# Patient Record
Sex: Female | Born: 1940 | Race: Black or African American | Hispanic: No
Health system: Southern US, Community
[De-identification: ages and names within clinical notes are randomized; demographics above are authoritative.]

---

## 2006-02-28 ENCOUNTER — Other Ambulatory Visit: Payer: Self-pay

## 2006-02-28 ENCOUNTER — Emergency Department: Payer: Self-pay | Admitting: Unknown Physician Specialty

## 2006-04-27 ENCOUNTER — Ambulatory Visit: Payer: Self-pay | Admitting: Gastroenterology

## 2006-11-08 ENCOUNTER — Ambulatory Visit: Payer: Self-pay | Admitting: Internal Medicine

## 2006-11-09 ENCOUNTER — Other Ambulatory Visit: Payer: Self-pay

## 2006-11-09 ENCOUNTER — Inpatient Hospital Stay: Payer: Self-pay | Admitting: Internal Medicine

## 2007-06-19 ENCOUNTER — Other Ambulatory Visit: Payer: Self-pay

## 2007-06-19 ENCOUNTER — Inpatient Hospital Stay: Payer: Self-pay | Admitting: Internal Medicine

## 2007-10-20 ENCOUNTER — Ambulatory Visit: Payer: Self-pay | Admitting: Internal Medicine

## 2008-08-21 ENCOUNTER — Emergency Department: Payer: Self-pay | Admitting: Internal Medicine

## 2008-12-26 ENCOUNTER — Inpatient Hospital Stay: Payer: Self-pay | Admitting: Internal Medicine

## 2009-06-06 ENCOUNTER — Inpatient Hospital Stay (HOSPITAL_COMMUNITY)
Admission: EM | Admit: 2009-06-06 | Discharge: 2009-06-10 | Payer: Self-pay | Source: Home / Self Care | Admitting: Emergency Medicine

## 2009-07-26 ENCOUNTER — Ambulatory Visit: Payer: Self-pay

## 2009-09-07 ENCOUNTER — Ambulatory Visit: Payer: Self-pay

## 2010-01-26 ENCOUNTER — Inpatient Hospital Stay (HOSPITAL_COMMUNITY)
Admission: EM | Admit: 2010-01-26 | Discharge: 2010-01-30 | Payer: Self-pay | Source: Home / Self Care | Attending: Internal Medicine | Admitting: Internal Medicine

## 2010-01-28 ENCOUNTER — Other Ambulatory Visit: Payer: Self-pay | Admitting: Internal Medicine

## 2010-01-28 ENCOUNTER — Encounter (INDEPENDENT_AMBULATORY_CARE_PROVIDER_SITE_OTHER): Payer: Self-pay | Admitting: Internal Medicine

## 2010-01-28 LAB — GLUCOSE, CAPILLARY
Glucose-Capillary: 199 mg/dL — ABNORMAL HIGH (ref 70–99)
Glucose-Capillary: 380 mg/dL — ABNORMAL HIGH (ref 70–99)
Glucose-Capillary: 183 mg/dL — ABNORMAL HIGH (ref 70–99)
Glucose-Capillary: 323 mg/dL — ABNORMAL HIGH (ref 70–99)

## 2010-01-28 LAB — CBC
HCT: 36.5 % (ref 36.0–46.0)
Hemoglobin: 11.6 g/dL — ABNORMAL LOW (ref 12.0–15.0)
MCH: 24.6 pg — ABNORMAL LOW (ref 26.0–34.0)
MCHC: 31.8 g/dL (ref 30.0–36.0)
MCV: 77.5 fL — ABNORMAL LOW (ref 78.0–100.0)
Platelets: 308 10*3/uL (ref 150–400)
RBC: 4.71 MIL/uL (ref 3.87–5.11)
RDW: 15.4 % (ref 11.5–15.5)
WBC: 5.4 10*3/uL (ref 4.0–10.5)

## 2010-01-28 LAB — BASIC METABOLIC PANEL
BUN: 17 mg/dL (ref 6–23)
CO2: 25 mEq/L (ref 19–32)
Calcium: 9.3 mg/dL (ref 8.4–10.5)
Chloride: 113 mEq/L — ABNORMAL HIGH (ref 96–112)
Creatinine, Ser: 1.62 mg/dL — ABNORMAL HIGH (ref 0.4–1.2)
GFR calc Af Amer: 38 mL/min — ABNORMAL LOW (ref >60)
GFR calc non Af Amer: 31 mL/min — ABNORMAL LOW (ref >60)
Glucose, Bld: 197 mg/dL — ABNORMAL HIGH (ref 70–99)
Potassium: 3.7 mEq/L (ref 3.5–5.1)
Sodium: 147 mEq/L — ABNORMAL HIGH (ref 135–145)

## 2010-01-28 LAB — TSH: TSH: 0.719 u[IU]/mL (ref 0.350–4.500)

## 2010-01-28 LAB — PROTIME-INR
INR: 2.25 — ABNORMAL HIGH (ref 0.00–1.49)
Prothrombin Time: 25 seconds — ABNORMAL HIGH (ref 11.6–15.2)

## 2010-01-28 LAB — PHOSPHORUS: Phosphorus: 2.9 mg/dL (ref 2.3–4.6)

## 2010-01-28 LAB — MAGNESIUM: Magnesium: 2 mg/dL (ref 1.5–2.5)

## 2010-01-28 LAB — HEMOGLOBIN A1C
Hgb A1c MFr Bld: 9.3 % — ABNORMAL HIGH (ref <5.7)
Mean Plasma Glucose: 220 mg/dL — ABNORMAL HIGH (ref <117)

## 2010-01-29 LAB — CBC
HCT: 36 % (ref 36.0–46.0)
Hemoglobin: 11.8 g/dL — ABNORMAL LOW (ref 12.0–15.0)
MCH: 25.4 pg — ABNORMAL LOW (ref 26.0–34.0)
MCHC: 32.8 g/dL (ref 30.0–36.0)
MCV: 77.4 fL — ABNORMAL LOW (ref 78.0–100.0)
Platelets: 296 10*3/uL (ref 150–400)
RBC: 4.65 MIL/uL (ref 3.87–5.11)
RDW: 15.4 % (ref 11.5–15.5)
WBC: 6.6 10*3/uL (ref 4.0–10.5)

## 2010-01-29 LAB — HEMOGLOBIN A1C
Hgb A1c MFr Bld: 9.7 % — ABNORMAL HIGH (ref <5.7)
Mean Plasma Glucose: 232 mg/dL — ABNORMAL HIGH (ref <117)

## 2010-01-29 LAB — GLUCOSE, CAPILLARY
Glucose-Capillary: 334 mg/dL — ABNORMAL HIGH (ref 70–99)
Glucose-Capillary: 303 mg/dL — ABNORMAL HIGH (ref 70–99)
Glucose-Capillary: 270 mg/dL — ABNORMAL HIGH (ref 70–99)
Glucose-Capillary: 285 mg/dL — ABNORMAL HIGH (ref 70–99)

## 2010-01-29 LAB — BASIC METABOLIC PANEL
BUN: 16 mg/dL (ref 6–23)
CO2: 25 mEq/L (ref 19–32)
Calcium: 9 mg/dL (ref 8.4–10.5)
Chloride: 109 mEq/L (ref 96–112)
Creatinine, Ser: 1.82 mg/dL — ABNORMAL HIGH (ref 0.4–1.2)
GFR calc Af Amer: 33 mL/min — ABNORMAL LOW (ref >60)
GFR calc non Af Amer: 27 mL/min — ABNORMAL LOW (ref >60)
Glucose, Bld: 351 mg/dL — ABNORMAL HIGH (ref 70–99)
Potassium: 3.9 mEq/L (ref 3.5–5.1)
Sodium: 142 mEq/L (ref 135–145)

## 2010-01-29 LAB — PROTIME-INR
INR: 2.14 — ABNORMAL HIGH (ref 0.00–1.49)
Prothrombin Time: 24.1 seconds — ABNORMAL HIGH (ref 11.6–15.2)

## 2010-01-30 LAB — BASIC METABOLIC PANEL
BUN: 14 mg/dL (ref 6–23)
CO2: 24 mEq/L (ref 19–32)
Calcium: 8.8 mg/dL (ref 8.4–10.5)
Chloride: 108 mEq/L (ref 96–112)
Creatinine, Ser: 1.72 mg/dL — ABNORMAL HIGH (ref 0.4–1.2)
GFR calc Af Amer: 35 mL/min — ABNORMAL LOW (ref >60)
GFR calc non Af Amer: 29 mL/min — ABNORMAL LOW (ref >60)
Glucose, Bld: 245 mg/dL — ABNORMAL HIGH (ref 70–99)
Potassium: 3.8 mEq/L (ref 3.5–5.1)
Sodium: 140 mEq/L (ref 135–145)

## 2010-01-30 LAB — GLUCOSE, CAPILLARY
Glucose-Capillary: 229 mg/dL — ABNORMAL HIGH (ref 70–99)
Glucose-Capillary: 209 mg/dL — ABNORMAL HIGH (ref 70–99)

## 2010-01-30 LAB — PROTIME-INR
INR: 2.31 — ABNORMAL HIGH (ref 0.00–1.49)
Prothrombin Time: 25.5 seconds — ABNORMAL HIGH (ref 11.6–15.2)

## 2010-03-04 ENCOUNTER — Emergency Department (HOSPITAL_COMMUNITY): Payer: Medicare Other

## 2010-03-04 ENCOUNTER — Inpatient Hospital Stay (HOSPITAL_COMMUNITY)
Admission: EM | Admit: 2010-03-04 | Discharge: 2010-03-17 | DRG: 385 | Disposition: A | Discharge status: Skilled Nursing Facility | Payer: Medicare Other | Attending: Internal Medicine | Admitting: Internal Medicine

## 2010-03-04 DIAGNOSIS — R5381 Other malaise: Secondary | ICD-10-CM | POA: Diagnosis present

## 2010-03-04 DIAGNOSIS — Z7901 Long term (current) use of anticoagulants: Secondary | ICD-10-CM

## 2010-03-04 DIAGNOSIS — Z7401 Bed confinement status: Secondary | ICD-10-CM

## 2010-03-04 DIAGNOSIS — G9340 Encephalopathy, unspecified: Secondary | ICD-10-CM | POA: Diagnosis present

## 2010-03-04 DIAGNOSIS — I129 Hypertensive chronic kidney disease with stage 1 through stage 4 chronic kidney disease, or unspecified chronic kidney disease: Secondary | ICD-10-CM | POA: Diagnosis present

## 2010-03-04 DIAGNOSIS — I4891 Unspecified atrial fibrillation: Secondary | ICD-10-CM | POA: Diagnosis present

## 2010-03-04 DIAGNOSIS — N179 Acute kidney failure, unspecified: Secondary | ICD-10-CM | POA: Diagnosis present

## 2010-03-04 DIAGNOSIS — J4489 Other specified chronic obstructive pulmonary disease: Secondary | ICD-10-CM | POA: Diagnosis present

## 2010-03-04 DIAGNOSIS — K7689 Other specified diseases of liver: Secondary | ICD-10-CM | POA: Diagnosis present

## 2010-03-04 DIAGNOSIS — K5900 Constipation, unspecified: Secondary | ICD-10-CM | POA: Diagnosis present

## 2010-03-04 DIAGNOSIS — D509 Iron deficiency anemia, unspecified: Secondary | ICD-10-CM | POA: Diagnosis present

## 2010-03-04 DIAGNOSIS — N183 Chronic kidney disease, stage 3 unspecified: Secondary | ICD-10-CM | POA: Diagnosis present

## 2010-03-04 DIAGNOSIS — K560 Paralytic ileus: Secondary | ICD-10-CM | POA: Diagnosis present

## 2010-03-04 DIAGNOSIS — K5010 Crohn's disease of large intestine without complications: Principal | ICD-10-CM | POA: Diagnosis present

## 2010-03-04 DIAGNOSIS — E119 Type 2 diabetes mellitus without complications: Secondary | ICD-10-CM | POA: Diagnosis present

## 2010-03-04 DIAGNOSIS — J449 Chronic obstructive pulmonary disease, unspecified: Secondary | ICD-10-CM | POA: Diagnosis present

## 2010-03-04 DIAGNOSIS — K56609 Unspecified intestinal obstruction, unspecified as to partial versus complete obstruction: Secondary | ICD-10-CM | POA: Diagnosis present

## 2010-03-04 LAB — COMPREHENSIVE METABOLIC PANEL
ALT: 22 U/L (ref 0–35)
AST: 20 U/L (ref 0–37)
Albumin: 3.5 g/dL (ref 3.5–5.2)
Alkaline Phosphatase: 181 U/L — ABNORMAL HIGH (ref 39–117)
BUN: 35 mg/dL — ABNORMAL HIGH (ref 6–23)
CO2: 24 mEq/L (ref 19–32)
Calcium: 9.7 mg/dL (ref 8.4–10.5)
Chloride: 110 mEq/L (ref 96–112)
Creatinine, Ser: 2.04 mg/dL — ABNORMAL HIGH (ref 0.4–1.2)
GFR calc Af Amer: 29 mL/min — ABNORMAL LOW (ref >60)
GFR calc non Af Amer: 24 mL/min — ABNORMAL LOW (ref >60)
Glucose, Bld: 91 mg/dL (ref 70–99)
Potassium: 3.5 mEq/L (ref 3.5–5.1)
Sodium: 146 mEq/L — ABNORMAL HIGH (ref 135–145)
Total Bilirubin: 0.8 mg/dL (ref 0.3–1.2)
Total Protein: 8 g/dL (ref 6.0–8.3)

## 2010-03-04 LAB — URINE MICROSCOPIC-ADD ON

## 2010-03-04 LAB — CBC
HCT: 38.3 % (ref 36.0–46.0)
Hemoglobin: 13.5 g/dL (ref 12.0–15.0)
MCH: 25.8 pg — ABNORMAL LOW (ref 26.0–34.0)
MCHC: 35.2 g/dL (ref 30.0–36.0)
MCV: 73.1 fL — ABNORMAL LOW (ref 78.0–100.0)
Platelets: 275 10*3/uL (ref 150–400)
RBC: 5.24 MIL/uL — ABNORMAL HIGH (ref 3.87–5.11)
RDW: 14.3 % (ref 11.5–15.5)
WBC: 8.1 10*3/uL (ref 4.0–10.5)

## 2010-03-04 LAB — DIFFERENTIAL
Basophils Absolute: 0 10*3/uL (ref 0.0–0.1)
Basophils Relative: 0 % (ref 0–1)
Eosinophils Absolute: 0.1 10*3/uL (ref 0.0–0.7)
Eosinophils Relative: 1 % (ref 0–5)
Lymphocytes Relative: 16 % (ref 12–46)
Lymphs Abs: 1.3 10*3/uL (ref 0.7–4.0)
Monocytes Absolute: 0.9 10*3/uL (ref 0.1–1.0)
Monocytes Relative: 11 % (ref 3–12)
Neutro Abs: 5.8 10*3/uL (ref 1.7–7.7)
Neutrophils Relative %: 72 % (ref 43–77)

## 2010-03-04 LAB — URINALYSIS, ROUTINE W REFLEX MICROSCOPIC
Bilirubin Urine: NEGATIVE
Nitrite: NEGATIVE
Protein, ur: >300 mg/dL — AB
Specific Gravity, Urine: 1.015 (ref 1.005–1.030)
Urine Glucose, Fasting: NEGATIVE mg/dL
Urobilinogen, UA: 0.2 mg/dL (ref 0.0–1.0)
pH: 6 (ref 5.0–8.0)

## 2010-03-04 LAB — AMMONIA: Ammonia: 35 umol/L (ref 11–35)

## 2010-03-04 LAB — LIPASE, BLOOD: Lipase: 24 U/L (ref 11–59)

## 2010-03-04 LAB — POCT CARDIAC MARKERS
CKMB, poc: 1.4 ng/mL (ref 1.0–8.0)
Myoglobin, poc: 291 ng/mL (ref 12–200)
Troponin i, poc: <0.05 ng/mL (ref 0.00–0.09)

## 2010-03-04 LAB — PROTIME-INR
INR: 3.15 — ABNORMAL HIGH (ref 0.00–1.49)
Prothrombin Time: 32.4 seconds — ABNORMAL HIGH (ref 11.6–15.2)

## 2010-03-04 MED ORDER — IOHEXOL 300 MG/ML  SOLN
100.0000 mL | Freq: Once | INTRAMUSCULAR | Status: AC | PRN
  Administered 2010-03-04: 100 mL via INTRAVENOUS

## 2010-03-05 ENCOUNTER — Emergency Department (HOSPITAL_COMMUNITY): Payer: Medicare Other

## 2010-03-05 LAB — GLUCOSE, CAPILLARY
Glucose-Capillary: 137 mg/dL — ABNORMAL HIGH (ref 70–99)
Glucose-Capillary: 126 mg/dL — ABNORMAL HIGH (ref 70–99)
Glucose-Capillary: 152 mg/dL — ABNORMAL HIGH (ref 70–99)
Glucose-Capillary: 121 mg/dL — ABNORMAL HIGH (ref 70–99)

## 2010-03-06 LAB — COMPREHENSIVE METABOLIC PANEL
CO2: 24 mEq/L (ref 19–32)
Calcium: 8.7 mg/dL (ref 8.4–10.5)
Creatinine, Ser: 1.93 mg/dL — ABNORMAL HIGH (ref 0.4–1.2)
GFR calc non Af Amer: 26 mL/min — ABNORMAL LOW (ref >60)
Glucose, Bld: 151 mg/dL — ABNORMAL HIGH (ref 70–99)
Total Bilirubin: 0.6 mg/dL (ref 0.3–1.2)

## 2010-03-06 LAB — MAGNESIUM: Magnesium: 2 mg/dL (ref 1.5–2.5)

## 2010-03-06 LAB — DIFFERENTIAL
Basophils Absolute: 0 10*3/uL (ref 0.0–0.1)
Eosinophils Absolute: 0.1 10*3/uL (ref 0.0–0.7)
Lymphocytes Relative: 30 % (ref 12–46)
Lymphs Abs: 1.4 10*3/uL (ref 0.7–4.0)
Monocytes Absolute: 0.4 10*3/uL (ref 0.1–1.0)
Neutro Abs: 2.8 10*3/uL (ref 1.7–7.7)

## 2010-03-06 LAB — GLUCOSE, CAPILLARY
Glucose-Capillary: 228 mg/dL — ABNORMAL HIGH (ref 70–99)
Glucose-Capillary: 195 mg/dL — ABNORMAL HIGH (ref 70–99)
Glucose-Capillary: 136 mg/dL — ABNORMAL HIGH (ref 70–99)

## 2010-03-06 LAB — CBC
MCH: 24.7 pg — ABNORMAL LOW (ref 26.0–34.0)
MCHC: 33.1 g/dL (ref 30.0–36.0)
Platelets: 237 10*3/uL (ref 150–400)
RDW: 14.7 % (ref 11.5–15.5)

## 2010-03-06 LAB — URINE CULTURE
Colony Count: NO GROWTH
Culture  Setup Time: 201202090344
Culture: NO GROWTH

## 2010-03-06 LAB — PROTIME-INR: Prothrombin Time: 36.5 seconds — ABNORMAL HIGH (ref 11.6–15.2)

## 2010-03-07 LAB — DIFFERENTIAL
Basophils Absolute: 0 10*3/uL (ref 0.0–0.1)
Eosinophils Relative: 1 % (ref 0–5)
Monocytes Absolute: 0.3 10*3/uL (ref 0.1–1.0)
Neutrophils Relative %: 79 % — ABNORMAL HIGH (ref 43–77)

## 2010-03-07 LAB — GLUCOSE, CAPILLARY
Glucose-Capillary: 164 mg/dL — ABNORMAL HIGH (ref 70–99)
Glucose-Capillary: 190 mg/dL — ABNORMAL HIGH (ref 70–99)
Glucose-Capillary: 142 mg/dL — ABNORMAL HIGH (ref 70–99)

## 2010-03-07 LAB — COMPREHENSIVE METABOLIC PANEL
AST: 14 U/L (ref 0–37)
Albumin: 2.9 g/dL — ABNORMAL LOW (ref 3.5–5.2)
BUN: 16 mg/dL (ref 6–23)
Calcium: 8.9 mg/dL (ref 8.4–10.5)
Chloride: 111 mEq/L (ref 96–112)
Creatinine, Ser: 1.87 mg/dL — ABNORMAL HIGH (ref 0.4–1.2)
GFR calc Af Amer: 32 mL/min — ABNORMAL LOW (ref >60)
Total Protein: 7.2 g/dL (ref 6.0–8.3)

## 2010-03-07 LAB — CBC
HCT: 34.9 % — ABNORMAL LOW (ref 36.0–46.0)
MCHC: 33.5 g/dL (ref 30.0–36.0)
Platelets: 250 10*3/uL (ref 150–400)
RDW: 14.6 % (ref 11.5–15.5)
WBC: 6.6 10*3/uL (ref 4.0–10.5)

## 2010-03-07 LAB — PROTIME-INR
INR: 3.54 — ABNORMAL HIGH (ref 0.00–1.49)
Prothrombin Time: 35.4 seconds — ABNORMAL HIGH (ref 11.6–15.2)

## 2010-03-08 ENCOUNTER — Inpatient Hospital Stay (HOSPITAL_COMMUNITY): Payer: Medicare Other

## 2010-03-08 LAB — BLOOD GAS, ARTERIAL
Bicarbonate: 22.6 mEq/L (ref 20.0–24.0)
O2 Saturation: 97 %
Patient temperature: 99.5
TCO2: 20.4 mmol/L (ref 0–100)
pH, Arterial: 7.444 — ABNORMAL HIGH (ref 7.350–7.400)

## 2010-03-08 LAB — COMPREHENSIVE METABOLIC PANEL
Albumin: 2.8 g/dL — ABNORMAL LOW (ref 3.5–5.2)
BUN: 14 mg/dL (ref 6–23)
Calcium: 8.8 mg/dL (ref 8.4–10.5)
Creatinine, Ser: 1.67 mg/dL — ABNORMAL HIGH (ref 0.4–1.2)
Glucose, Bld: 192 mg/dL — ABNORMAL HIGH (ref 70–99)
Total Protein: 6.7 g/dL (ref 6.0–8.3)

## 2010-03-08 LAB — DIFFERENTIAL
Basophils Relative: 1 % (ref 0–1)
Eosinophils Relative: 1 % (ref 0–5)
Lymphocytes Relative: 17 % (ref 12–46)
Neutrophils Relative %: 75 % (ref 43–77)

## 2010-03-08 LAB — GLUCOSE, CAPILLARY
Glucose-Capillary: 219 mg/dL — ABNORMAL HIGH (ref 70–99)
Glucose-Capillary: 165 mg/dL — ABNORMAL HIGH (ref 70–99)

## 2010-03-08 LAB — CBC
HCT: 33.5 % — ABNORMAL LOW (ref 36.0–46.0)
MCV: 73.3 fL — ABNORMAL LOW (ref 78.0–100.0)
Platelets: 247 10*3/uL (ref 150–400)
RBC: 4.57 MIL/uL (ref 3.87–5.11)
WBC: 6 10*3/uL (ref 4.0–10.5)

## 2010-03-08 LAB — PROTIME-INR: Prothrombin Time: 31.7 seconds — ABNORMAL HIGH (ref 11.6–15.2)

## 2010-03-08 LAB — MAGNESIUM: Magnesium: 2.1 mg/dL (ref 1.5–2.5)

## 2010-03-08 LAB — APTT: aPTT: 52 seconds — ABNORMAL HIGH (ref 24–37)

## 2010-03-08 NOTE — H&P (Signed)
Catherine Burton, Catherine Burton              ACCOUNT NO.:  [id number]  MEDICAL RECORD NO.:  [id number]           PATIENT TYPE:  E  LOCATION:  WLED                         FACILITY:  [facility]  PHYSICIAN:  Conley Canal, MD      DATE OF BIRTH:  Jun 17, 1940  DATE OF ADMISSION:  03/04/2010 DATE OF DISCHARGE:                             HISTORY & PHYSICAL   PRIMARY CARE PHYSICIAN:  Maxwell Caul, M.D.  CHIEF COMPLAINT:  Abdominal pain, confusion.  HISTORY OF PRESENT ILLNESS:  Ms. Brissette is a 70 year old female with multiple comorbidities including atrial fibrillation, hypertension, diabetes mellitus type 2, COPD, CKD stage 3, who comes in with complaints of altered mental status and some abdominal pain.  The patient is a poor historian.  Most of the history is obtained from emergency room records and previous medical records.  She says that she came in because of fever but does not able to elaborate.  When she presented to the emergency room, she had CT abdomen and pelvis which suggested distended sigmoid colon with possibility of inflammation.  It also showed some largest scattered hepatic cysts which are exophytic with varying level of complexity and nodularity.  She also had a CT of the brain which did not show acute intracranial abnormalities but some mild chronic right maxillary sinusitis.  She was referred to hospitalist service for further management.  PAST MEDICAL HISTORY: 1. Diabetes mellitus type 2. 2. CKD, stage 3. 3. Atrial fibrillation, chronically anticoagulated. 4. Anemia of chronic disease. 5. COPD.  ALLERGIES:  CODEINE, CELEXA, NOVOCAIN.  HOME MEDICATIONS:  Advair, albuterol, Avonex, baclofen, calcium, Cardizem, Coumadin, Robitussin, Lantus, lovastatin, Lopressor, multivitamins, Prilosec, oxybutynin, Senokot, Spiriva, vitamin C, Xanax, Vicodin, Tylenol.  FAMILY HISTORY:  Could not be ascertained at this point.  REVIEW OF SYSTEMS:  Unremarkable except as highlighted  in the history of present illness.  PHYSICAL EXAMINATION:  GENERAL:  On examination, this is a frail elderly lady who is somnolent but easily arousable. VITAL SIGNS:  Vitals are blood pressure 139/57, heart rate 68, temperature 97.7, respirations 16, oxygen saturation is 96% on 2 L nasal cannula. HEENT:  Head, Ears, Nose and Throat Examination:  Pupils equal and reacting to light.  No jugular venous distention, no carotid bruits. RESPIRATORY SYSTEM:  Bilateral air entry with no rhonchi, rales, wheezes. CARDIOVASCULAR SYSTEM:  First and heart sounds heard.  No murmurs. Pulse regular. ABDOMEN:  Obese, soft, nontender.  Positive bowel sounds. CNS:  The patient is somnolent.  Attempts to answer questions but disoriented.  Follows commands. EXTREMITIES:  No pedal edema.  Peripheral pulses equal.  LABORATORY DATA:  Labs were reviewed.  Significant for WBC 8.1, hemoglobin 13.5, hematocrit 38.3, platelet count 275.  Sodium 146, potassium 3.5, BUN 35, creatinine 2.04, alkaline phosphatase 181, calcium 9.7, glucose 91, lipase 24.  Urinalysis showed protein greater than 300, nitrites negative, leukocytes large, wbcs too numerous to count, rbcs 11 to 20, many bacteria and many yeast.  Ammonia level 35. CT of abdomen, pelvis as described above.  IMPRESSION:  This is a 70 year old female with multiple comorbidities who is presenting with abdominal pain with concern for sigmoid  colitis, possibility of infected cystic lesions of the liver.  She also has a urinary tract infection.  Sigmoid colitis.  Hepatic cysts.  The plan is, we will start the patient on broad-spectrum antibiotics including Flagyl as well as Zosyn.  The patient was previously treated for Enterococcus UTI with quinolones.  Hence, we will not use quinolones at this point, consider reimaging after a few days of antibiotics. 1. Urinary tract infection, bacterial, fungal.  The patient will be on     Zosyn.  We will also add  fluconazole. 2. Diabetes mellitus type 2.  Lantus and sliding scale insulin. 3. Atrial fibrillation, chronically anticoagulated.  The patient on     Coumadin.  We will continue the same.  INR 3.15 today.  We will     need close monitoring with antibiotics. 4. Chronic obstructive pulmonary disease, seems stable.  Continue home     medications. 5. Hypertension, stable.  Continue home medications. 6. Gastrointestinal prophylaxis.  PPI. 7. Anxiety disorder.  Resume home medications once confirmed. 8. The patient's condition is guarded.     Conley Canal, MD     SR/MEDQ  D:  03/05/2010  T:  03/05/2010  Job:  [id number]  cc:   Maxwell Caul, M.D.  Electronically Signed by Conley Canal  on 03/05/2010 08:31:23 PM

## 2010-03-09 LAB — BASIC METABOLIC PANEL
BUN: 12 mg/dL (ref 6–23)
Calcium: 9 mg/dL (ref 8.4–10.5)
Chloride: 116 mEq/L — ABNORMAL HIGH (ref 96–112)
Creatinine, Ser: 1.57 mg/dL — ABNORMAL HIGH (ref 0.4–1.2)
GFR calc Af Amer: 39 mL/min — ABNORMAL LOW (ref >60)
GFR calc non Af Amer: 32 mL/min — ABNORMAL LOW (ref >60)

## 2010-03-09 LAB — GLUCOSE, CAPILLARY: Glucose-Capillary: 198 mg/dL — ABNORMAL HIGH (ref 70–99)

## 2010-03-09 LAB — PROTIME-INR
INR: 3 — ABNORMAL HIGH (ref 0.00–1.49)
Prothrombin Time: 31.2 seconds — ABNORMAL HIGH (ref 11.6–15.2)

## 2010-03-10 LAB — GLUCOSE, CAPILLARY: Glucose-Capillary: 279 mg/dL — ABNORMAL HIGH (ref 70–99)

## 2010-03-10 LAB — PROTIME-INR
INR: 3.38 — ABNORMAL HIGH (ref 0.00–1.49)
Prothrombin Time: 34.2 seconds — ABNORMAL HIGH (ref 11.6–15.2)

## 2010-03-11 ENCOUNTER — Inpatient Hospital Stay (HOSPITAL_COMMUNITY): Payer: Medicare Other

## 2010-03-11 ENCOUNTER — Other Ambulatory Visit (HOSPITAL_COMMUNITY): Payer: Medicare Other

## 2010-03-11 LAB — PROTIME-INR
INR: 3.59 — ABNORMAL HIGH (ref 0.00–1.49)
INR: 2.94 — ABNORMAL HIGH (ref 0.00–1.49)
Prothrombin Time: 35.8 seconds — ABNORMAL HIGH (ref 11.6–15.2)
Prothrombin Time: 30.7 seconds — ABNORMAL HIGH (ref 11.6–15.2)

## 2010-03-11 LAB — COMPREHENSIVE METABOLIC PANEL
ALT: 9 U/L (ref 0–35)
Albumin: 2.7 g/dL — ABNORMAL LOW (ref 3.5–5.2)
Alkaline Phosphatase: 112 U/L (ref 39–117)
Potassium: 3.6 mEq/L (ref 3.5–5.1)
Sodium: 142 mEq/L (ref 135–145)
Total Protein: 6.2 g/dL (ref 6.0–8.3)

## 2010-03-11 LAB — GLUCOSE, CAPILLARY
Glucose-Capillary: 206 mg/dL — ABNORMAL HIGH (ref 70–99)
Glucose-Capillary: 101 mg/dL — ABNORMAL HIGH (ref 70–99)

## 2010-03-11 LAB — CBC
HCT: 31 % — ABNORMAL LOW (ref 36.0–46.0)
Hemoglobin: 10.4 g/dL — ABNORMAL LOW (ref 12.0–15.0)
MCHC: 33.5 g/dL (ref 30.0–36.0)
WBC: 6.7 10*3/uL (ref 4.0–10.5)

## 2010-03-11 NOTE — Progress Notes (Signed)
NAME:  Catherine Burton, Catherine Burton              ACCOUNT NO.:  [id number]  MEDICAL RECORD NO.:  [id number]           PATIENT TYPE:  I  LOCATION:  1520                         FACILITY:  [facility]  PHYSICIAN:  Charlestine Massed, MDDATE OF BIRTH:  [birth year]                                PROGRESS NOTE   SUBJECTIVE: The patient was seen and examined today, not in any distress and feels a lot better now.  Mental status has improved more than previously.  She is tolerating feeds.  No nausea, vomiting, is passing gas.  OBJECTIVE: VITAL SIGNS:  Temperature 98.4, heart rate 98, respirations 22, blood pressure 122/77, and O2 sat 96% on room air on 1 L oxygen. GENERAL:  The patient awake and alert, not in any distress.  Afebrile. HEAD AND NECK:  Tongue is moist.  No JVD.  No bruit. CHEST:  Bilateral air entry is good anteriorly and posteriorly.  No rales or wheeze heard. CARDIAC:  S1 and S2 regular.  No murmurs. ABDOMEN:  Soft, slightly distended, but no tenderness.  Bowel sounds are gurgling and heard much better than it was yesterday. EXTREMITIES:  Trace pedal edema. CNS:  Much more awake and alert.  LABORATORY DATA: INR is 3.38.  ASSESSMENT AND PLAN: 1. Partial small-bowel obstruction - resolving slowly - currently the     patient has been upgraded to full liquids and then to soft diet.     If she is tolerating this died then she can be discharged in a day     or 2 with this diet.     a.     The patient has sigmoid colitis to begin with, and so      continuing treatment with Flagyl and Vantin for coverage of gram      negatives.  A total of 5 to 7 days of treatment with both      antibiotics will be enough.  Flagyl is expected to be stopped on      February 16, and the Varney Baas is also expected to be stopped on      February 16.     b.     The patient is currently very well stable and not having any      further diarrhea.     c.     She is passing gas well. 2. Confusion - the patient was  extremely confused 2 days ago with     hallucinations of seeing people in the room.  Currently, she does     not have, I believe this is mainly due to withdrawal from baclofen,     which was restarted yesterday, after which her symptoms are long     better. 3. Diabetes mellitus, currently blood sugars are stable.  We will     continue the medications with Lantus and NovoLog sliding scale. 4. Hypertension.  She is on clonidine and lisinopril at this time and     we will continue those medications. 5. Atrial fibrillation, currently stable, heart rate is well-     controlled.  She is on Coumadin anticoagulation. 6. Chronic kidney disease stage 3.  No further  worsening noted. 7. Chronic obstructive pulmonary disease.  Continue Advair and     Spiriva.  DISPOSITION: The patient will be observed for another 1 to 2 days when the abdominal distention and obstruction has been relieved fully and tolerating food well, then we can discharge her to the skilled nursing facility.  A total of 20 minutes spent on today's evaluation and assessment.     Charlestine Massed, MD     UT/MEDQ  D:  03/10/2010  T:  03/10/2010  Job:  [id number]  Electronically Signed by Charlestine Massed MD on 03/11/2010 03:16:31 PM

## 2010-03-12 ENCOUNTER — Inpatient Hospital Stay (HOSPITAL_COMMUNITY): Payer: Medicare Other

## 2010-03-12 LAB — GLUCOSE, CAPILLARY: Glucose-Capillary: 140 mg/dL — ABNORMAL HIGH (ref 70–99)

## 2010-03-12 LAB — PROTIME-INR
INR: 2.27 — ABNORMAL HIGH (ref 0.00–1.49)
Prothrombin Time: 25.2 seconds — ABNORMAL HIGH (ref 11.6–15.2)

## 2010-03-13 DIAGNOSIS — K7689 Other specified diseases of liver: Secondary | ICD-10-CM

## 2010-03-13 LAB — URINALYSIS, ROUTINE W REFLEX MICROSCOPIC
Bilirubin Urine: NEGATIVE
Hgb urine dipstick: NEGATIVE
Ketones, ur: NEGATIVE mg/dL
Protein, ur: 100 mg/dL — AB
Urobilinogen, UA: 0.2 mg/dL (ref 0.0–1.0)

## 2010-03-13 LAB — GLUCOSE, CAPILLARY
Glucose-Capillary: 167 mg/dL — ABNORMAL HIGH (ref 70–99)
Glucose-Capillary: 231 mg/dL — ABNORMAL HIGH (ref 70–99)
Glucose-Capillary: 240 mg/dL — ABNORMAL HIGH (ref 70–99)

## 2010-03-13 LAB — BASIC METABOLIC PANEL
GFR calc Af Amer: 34 mL/min — ABNORMAL LOW (ref >60)
GFR calc non Af Amer: 28 mL/min — ABNORMAL LOW (ref >60)
Glucose, Bld: 127 mg/dL — ABNORMAL HIGH (ref 70–99)
Potassium: 3.2 mEq/L — ABNORMAL LOW (ref 3.5–5.1)
Sodium: 140 mEq/L (ref 135–145)

## 2010-03-13 LAB — PROTIME-INR
INR: 1.85 — ABNORMAL HIGH (ref 0.00–1.49)
Prothrombin Time: 21.5 seconds — ABNORMAL HIGH (ref 11.6–15.2)

## 2010-03-13 LAB — CBC
HCT: 30 % — ABNORMAL LOW (ref 36.0–46.0)
RDW: 15.1 % (ref 11.5–15.5)
WBC: 6 10*3/uL (ref 4.0–10.5)

## 2010-03-13 LAB — URINE MICROSCOPIC-ADD ON

## 2010-03-14 LAB — BASIC METABOLIC PANEL
BUN: 17 mg/dL (ref 6–23)
CO2: 20 mEq/L (ref 19–32)
Chloride: 111 mEq/L (ref 96–112)
Creatinine, Ser: 1.77 mg/dL — ABNORMAL HIGH (ref 0.4–1.2)
Potassium: 3.7 mEq/L (ref 3.5–5.1)

## 2010-03-14 LAB — CBC
HCT: 30.5 % — ABNORMAL LOW (ref 36.0–46.0)
MCH: 24.5 pg — ABNORMAL LOW (ref 26.0–34.0)
MCV: 74 fL — ABNORMAL LOW (ref 78.0–100.0)
Platelets: 239 10*3/uL (ref 150–400)
RDW: 15.2 % (ref 11.5–15.5)

## 2010-03-14 LAB — GLUCOSE, CAPILLARY
Glucose-Capillary: 256 mg/dL — ABNORMAL HIGH (ref 70–99)
Glucose-Capillary: 187 mg/dL — ABNORMAL HIGH (ref 70–99)

## 2010-03-14 LAB — URINE CULTURE: Colony Count: >=100000

## 2010-03-15 ENCOUNTER — Inpatient Hospital Stay (HOSPITAL_COMMUNITY): Payer: Medicare Other

## 2010-03-15 LAB — BASIC METABOLIC PANEL
CO2: 23 mEq/L (ref 19–32)
Calcium: 8.9 mg/dL (ref 8.4–10.5)
Chloride: 113 mEq/L — ABNORMAL HIGH (ref 96–112)
GFR calc Af Amer: 30 mL/min — ABNORMAL LOW (ref >60)
Glucose, Bld: 175 mg/dL — ABNORMAL HIGH (ref 70–99)
Sodium: 142 mEq/L (ref 135–145)

## 2010-03-15 LAB — GLUCOSE, CAPILLARY: Glucose-Capillary: 177 mg/dL — ABNORMAL HIGH (ref 70–99)

## 2010-03-15 LAB — HEMOGLOBIN A1C
Hgb A1c MFr Bld: 9.2 % — ABNORMAL HIGH (ref <5.7)
Mean Plasma Glucose: 217 mg/dL — ABNORMAL HIGH (ref <117)

## 2010-03-15 LAB — CBC
HCT: 30.1 % — ABNORMAL LOW (ref 36.0–46.0)
Hemoglobin: 9.9 g/dL — ABNORMAL LOW (ref 12.0–15.0)
MCH: 24.4 pg — ABNORMAL LOW (ref 26.0–34.0)
MCHC: 32.9 g/dL (ref 30.0–36.0)
RBC: 4.05 MIL/uL (ref 3.87–5.11)

## 2010-03-15 LAB — PROTIME-INR: INR: 1.5 — ABNORMAL HIGH (ref 0.00–1.49)

## 2010-03-16 LAB — CBC
HCT: 32.8 % — ABNORMAL LOW (ref 36.0–46.0)
Hemoglobin: 10.8 g/dL — ABNORMAL LOW (ref 12.0–15.0)
MCV: 74.5 fL — ABNORMAL LOW (ref 78.0–100.0)
RBC: 4.4 MIL/uL (ref 3.87–5.11)
WBC: 7 10*3/uL (ref 4.0–10.5)

## 2010-03-16 LAB — BASIC METABOLIC PANEL
CO2: 24 mEq/L (ref 19–32)
Chloride: 111 mEq/L (ref 96–112)
Creatinine, Ser: 1.83 mg/dL — ABNORMAL HIGH (ref 0.4–1.2)
GFR calc Af Amer: 33 mL/min — ABNORMAL LOW (ref >60)
Potassium: 3.8 mEq/L (ref 3.5–5.1)
Sodium: 140 mEq/L (ref 135–145)

## 2010-03-16 LAB — GLUCOSE, CAPILLARY
Glucose-Capillary: 169 mg/dL — ABNORMAL HIGH (ref 70–99)
Glucose-Capillary: 214 mg/dL — ABNORMAL HIGH (ref 70–99)
Glucose-Capillary: 139 mg/dL — ABNORMAL HIGH (ref 70–99)

## 2010-03-16 LAB — LACTIC ACID, PLASMA: Lactic Acid, Venous: 1.1 mmol/L (ref 0.5–2.2)

## 2010-03-16 LAB — PROTIME-INR: INR: 1.51 — ABNORMAL HIGH (ref 0.00–1.49)

## 2010-03-17 LAB — BASIC METABOLIC PANEL
BUN: 24 mg/dL — ABNORMAL HIGH (ref 6–23)
Calcium: 8.9 mg/dL (ref 8.4–10.5)
GFR calc non Af Amer: 27 mL/min — ABNORMAL LOW (ref >60)
Glucose, Bld: 225 mg/dL — ABNORMAL HIGH (ref 70–99)
Sodium: 138 mEq/L (ref 135–145)

## 2010-03-17 LAB — CBC
HCT: 30.8 % — ABNORMAL LOW (ref 36.0–46.0)
MCHC: 32.8 g/dL (ref 30.0–36.0)
Platelets: 269 10*3/uL (ref 150–400)
RDW: 16.1 % — ABNORMAL HIGH (ref 11.5–15.5)
WBC: 6.6 10*3/uL (ref 4.0–10.5)

## 2010-03-17 LAB — CULTURE, BLOOD (ROUTINE X 2)
Culture  Setup Time: 201202151647
Culture: NO GROWTH

## 2010-03-17 LAB — GLUCOSE, CAPILLARY: Glucose-Capillary: 272 mg/dL — ABNORMAL HIGH (ref 70–99)

## 2010-03-17 LAB — PROTIME-INR: Prothrombin Time: 21 seconds — ABNORMAL HIGH (ref 11.6–15.2)

## 2010-03-25 NOTE — Discharge Summary (Signed)
NAME:  Catherine Burton, Catherine Burton              ACCOUNT NO.:  [id number]  MEDICAL RECORD NO.:  [id number]           PATIENT TYPE:  I  LOCATION:  1433                         FACILITY:  [facility]  PHYSICIAN:  Cynda Soule I Ruchel Brandenburger, MD      DATE OF BIRTH:  Aug 31, 1940  DATE OF ADMISSION:  03/04/2010 DATE OF DISCHARGE:  03/17/2010                              DISCHARGE SUMMARY   PRIMARY CARE PHYSICIAN:  Maxwell Caul, M.D.  DISCHARGE DIAGNOSES: 1. Altered mental status, resolved. 2. Partial small-bowel obstruction, resolved, tolerate diet. 3. Severe constipation, resolved. 4. Sigmoid colitis, completed treatment. 5. Acute on chronic renal failure, felt to be secondary to diuretics,     baseline creatinine around 1.7.  Today, creatinine is 1.8. 6. Generalized deconditioning, the patient is bedbound according to     the nursing home. 7. Insulin-dependent diabetes mellitus. 8. Hypertension. 9. Atrial fibrillation, rate under control.  She is on chronic     anticoagulation.  INR today 1.79. 10.Anemia, microcytic hyperchromic anemia, with hemoglobin stable at     around 10.8, need to be further investigated as an outpatient.     Hemoglobin remained stable during hospital stay. 11.Chronic kidney disease, stage III. 12.History of chronic obstructive pulmonary disease. 13.Multiple liver cysts, largest measuring 9.7 cm, ill-defined     internal echogenicity material peripherally, 7.9 cm cyst containing     fluid and debris.  CONSULTATION:  Infectious disease consulted, by Dr. Maurice March for evaluation of abnormal CT scan of the liver.  PROCEDURES: 1. CT scan, prominent distended sigmoid colon without evidence of     strangulation or swirling is undecided to suggest volvulus, may be     some inflammation of the sigmoid colon indicating inflammation.     Large scattered hepatic cysts some of which are exophytic with     varying complexity.  May also have internal nodularity suspicious     for infectious process  such as hydatid cyst versus cholecystic     liver disease, emphysema. 2. Nonspecific left abdomen mass which is most likely secondary to     adenoma, status post right nephrectomy. 3. Chest x-ray, cardiomegaly, bibasilar atelectasis. 4. Ultrasound of the abdomen, multiple liver cysts including complex     cyst, cholelithiasis without evidence of cholecystitis. 5. CT abdomen and pelvis without contrast with stable liver cyst,     cholelithiasis, fecal distention of the left colon and rectum, no     evidence of obstruction.  HISTORY OF PRESENT ILLNESS:  This is a 70 year old female with multiple comorbidities including AFib, obesity, hypertension, diabetes mellitus type 2, COPD, status post right nephrectomy, chronic kidney disease stage III, who came with altered mental status and some abdominal pain. The patient is really poor historian and history mainly obtained from the emergency room.  When she presented, her CT abdomen and pelvis suggest distended sigmoid with possibility of inflammation.  Altered mental status, the patient has CT head without contrast which was normal.  No acute event, chronic right maxillary sinusitis, periventricular corona radiata white matter hypodensity most compatible with chronic ischemic microvascular white matter disease.  No significant abnormalities.  During hospital  stay, mentation remained improved and this is felt could be secondary to baclofen withdrawal, part of it could be dementia and infection.  The patient treated with Flagyl antibiotic and the patient returned to her baseline. 1. Partial small-bowel obstruction, ileus/severe constipation.  The     patient's condition improved, tolerated diet, have regular bowel     movements and the patient denies any abdominal pain, nausea and     vomiting.  Her lactic acid was normal and there is no evidence of     metabolic acidosis. 2. Chronic renal insufficiency.  The patient received few dose of      Lasix.  Creatinine creep up to 2.02 which could be the patient's     baseline and diuretics were discontinued and the patient will need     to take her creatinine within the next week. 3. Atrial fibrillation, rate remained under good control and the     patient remained on her medications.  Coumadin is subtherapeutic at     1.76.  We will increase Coumadin to 6 mg today and tomorrow and ask     nursing home to adjust the dose according to the result of the PT,     INR. 4. The patient has CT scan which suggest complex liver cyst,     possibility of hydatid disease.  Infectious Disease consulted, done     by Dr. Maurice March where Dr. Maurice March doubt there is any echinococcal in     origin and he does not feel it is related to hydatid cyst, it could     be related to cholecystic liver disease and Echinococcal IgG     antibodies sent to the lab.  We will have the nursing home to     follow up on to that Echinococcal IgG antibodies and followup those     liver cysts closely, could be unlikely malignant but we will ask to     check alpha fetoprotein as an outpatient. 5. Hyperchromic macrocystic anemia.  Hemoglobin remained stable around     10.8, this could be further monitored and investigated as an     outpatient.  The patient on Coumadin. 6. Hypertension, remained stable. 7. Generalized deconditioning.  The patient is almost bedbound and we     will check that with nursing home staff.  The patient at baseline.     The swallowing and speech did see the patient and they noticed some     dysphagia with silent aspiration.  Diet changed to D3 with honey     and carb modified; could do MBS, as the patient has just have     partial small-bowel obstruction and ileus and this modified barium     could complicate the story.  So, we will prefer modified barium     swallow to be done as an outpatient.  The patient appeared     tolerating D3 with honey and carb modified.  Currently, we felt the     patient is  stable to be transferred back to nursing home.  I have     one comment, I noticed the patient on Avonex interferon beta 13 mcg     injection intramuscular weekly.  The indication for the above is     not yet known to be.  I will leave the decision to continue or     stopping above medicine to Dr. Baltazar Najjar.  DISCHARGE MEDICATIONS: 1. Dulcolax suppository 10 mg rectally daily. 2. Hydralazine 25  mg twice daily. 3. Baclofen 10 mg 4 times daily. 4. Warfarin 6 mg today and PT, INR should be checked by nursing home     tomorrow. 5. Advair Diskus 250/50 one puff twice daily. 6. Albuterol inhaler 2 puffs q.4h. as needed. 7. Artificial tears. 8. Ascorbic acid 500 mg daily. 9. Avonex 30 mcg intramuscular weekly. 10.Calcium carbonate 600 mg twice daily. 11.Cranberry 425 three capsules. 12.Diltiazem extended release 240 mg daily. 13.Insulin Lantus 22 units subcu twice daily. 14.Lovastatin 40 mg at bedtime. 15.Mucinex 600 mg twice daily. 16.Multivitamin 1 tab daily. 17.Metoprolol 50 mg twice daily. 18.Omeprazole 20 mg daily. 19.Oxybutynin 5 mg daily. 20.Oxycodone 5 mg q.4h. as needed. 21.Protein supplement. 22.Senokot 8.6 daily. 23.Spiriva 18 mcg daily. 24.Tylenol 1 tab q.4h. as needed. 25.Xanax 0.5 mg daily.  DISCHARGE INSTRUCTIONS: 1. Currently, we felt the patient is stable for discharge.  Nursing     home to check Coumadin level, kidney function and CBC.  Also need     to follow up those liver cysts closely. 2. Indication for Avonex interferon beta injection need to be     clarified by the patient's MD.     Romey Cohea Bosie Helper, MD     HIE/MEDQ  D:  03/17/2010  T:  03/17/2010  Job:  [id number]  cc:   Maxwell Caul, M.D.  Electronically Signed by Ebony Cargo MD on 03/25/2010 02:19:23 PM

## 2010-03-27 ENCOUNTER — Ambulatory Visit: Payer: Self-pay | Admitting: Geriatric Medicine

## 2010-03-27 LAB — MISCELLANEOUS TEST

## 2010-04-06 LAB — POCT I-STAT, CHEM 8
Chloride: 113 mEq/L — ABNORMAL HIGH (ref 96–112)
HCT: 40 % (ref 36.0–46.0)
Potassium: 4.1 mEq/L (ref 3.5–5.1)

## 2010-04-06 LAB — COMPREHENSIVE METABOLIC PANEL
ALT: 12 U/L (ref 0–35)
AST: 16 U/L (ref 0–37)
Calcium: 9.9 mg/dL (ref 8.4–10.5)
GFR calc Af Amer: 36 mL/min — ABNORMAL LOW (ref >60)
Glucose, Bld: 211 mg/dL — ABNORMAL HIGH (ref 70–99)
Sodium: 144 mEq/L (ref 135–145)
Total Protein: 7.4 g/dL (ref 6.0–8.3)

## 2010-04-06 LAB — PROTIME-INR
INR: 1.88 — ABNORMAL HIGH (ref 0.00–1.49)
INR: 2.24 — ABNORMAL HIGH (ref 0.00–1.49)
Prothrombin Time: 24.9 seconds — ABNORMAL HIGH (ref 11.6–15.2)

## 2010-04-06 LAB — CK TOTAL AND CKMB (NOT AT ARMC)
CK, MB: 1.2 ng/mL (ref 0.3–4.0)
Relative Index: INVALID (ref 0.0–2.5)

## 2010-04-06 LAB — URINE MICROSCOPIC-ADD ON

## 2010-04-06 LAB — CBC
MCHC: 33.8 g/dL (ref 30.0–36.0)
RDW: 15 % (ref 11.5–15.5)

## 2010-04-06 LAB — URINE CULTURE
Colony Count: >=100000
Culture  Setup Time: 201201022308

## 2010-04-06 LAB — URINALYSIS, ROUTINE W REFLEX MICROSCOPIC
Bilirubin Urine: NEGATIVE
Glucose, UA: NEGATIVE mg/dL
Ketones, ur: NEGATIVE mg/dL
pH: 7 (ref 5.0–8.0)

## 2010-04-06 LAB — DIFFERENTIAL
Eosinophils Absolute: 0.2 10*3/uL (ref 0.0–0.7)
Lymphs Abs: 1.6 10*3/uL (ref 0.7–4.0)
Monocytes Relative: 5 % (ref 3–12)
Neutrophils Relative %: 72 % (ref 43–77)

## 2010-04-06 LAB — GLUCOSE, CAPILLARY
Glucose-Capillary: 209 mg/dL — ABNORMAL HIGH (ref 70–99)
Glucose-Capillary: 267 mg/dL — ABNORMAL HIGH (ref 70–99)
Glucose-Capillary: 328 mg/dL — ABNORMAL HIGH (ref 70–99)
Glucose-Capillary: 434 mg/dL — ABNORMAL HIGH (ref 70–99)
Glucose-Capillary: 435 mg/dL — ABNORMAL HIGH (ref 70–99)

## 2010-04-06 LAB — TROPONIN I: Troponin I: 0.03 ng/mL (ref 0.00–0.06)

## 2010-04-06 LAB — MRSA PCR SCREENING: MRSA by PCR: POSITIVE — AB

## 2010-04-13 LAB — FOLATE: Folate: 7.2 ng/mL

## 2010-04-13 LAB — BASIC METABOLIC PANEL
BUN: 24 mg/dL — ABNORMAL HIGH (ref 6–23)
BUN: 25 mg/dL — ABNORMAL HIGH (ref 6–23)
CO2: 29 mEq/L (ref 19–32)
Calcium: 8.9 mg/dL (ref 8.4–10.5)
Chloride: 104 mEq/L (ref 96–112)
Chloride: 106 mEq/L (ref 96–112)
Chloride: 107 mEq/L (ref 96–112)
Creatinine, Ser: 1.9 mg/dL — ABNORMAL HIGH (ref 0.4–1.2)
Glucose, Bld: 134 mg/dL — ABNORMAL HIGH (ref 70–99)
Glucose, Bld: 207 mg/dL — ABNORMAL HIGH (ref 70–99)
Potassium: 3.7 mEq/L (ref 3.5–5.1)
Potassium: 3.9 mEq/L (ref 3.5–5.1)
Sodium: 139 mEq/L (ref 135–145)

## 2010-04-13 LAB — CBC
HCT: 28.7 % — ABNORMAL LOW (ref 36.0–46.0)
HCT: 28.7 % — ABNORMAL LOW (ref 36.0–46.0)
Hemoglobin: 9.6 g/dL — ABNORMAL LOW (ref 12.0–15.0)
MCHC: 33.3 g/dL (ref 30.0–36.0)
MCHC: 33.6 g/dL (ref 30.0–36.0)
MCV: 78.9 fL (ref 78.0–100.0)
MCV: 79 fL (ref 78.0–100.0)
MCV: 79.5 fL (ref 78.0–100.0)
Platelets: 275 10*3/uL (ref 150–400)
Platelets: 286 10*3/uL (ref 150–400)
RDW: 14.7 % (ref 11.5–15.5)
RDW: 15 % (ref 11.5–15.5)
WBC: 6.5 10*3/uL (ref 4.0–10.5)

## 2010-04-13 LAB — GLUCOSE, CAPILLARY
Glucose-Capillary: 129 mg/dL — ABNORMAL HIGH (ref 70–99)
Glucose-Capillary: 139 mg/dL — ABNORMAL HIGH (ref 70–99)
Glucose-Capillary: 157 mg/dL — ABNORMAL HIGH (ref 70–99)
Glucose-Capillary: 159 mg/dL — ABNORMAL HIGH (ref 70–99)
Glucose-Capillary: 174 mg/dL — ABNORMAL HIGH (ref 70–99)
Glucose-Capillary: 175 mg/dL — ABNORMAL HIGH (ref 70–99)
Glucose-Capillary: 187 mg/dL — ABNORMAL HIGH (ref 70–99)
Glucose-Capillary: 212 mg/dL — ABNORMAL HIGH (ref 70–99)

## 2010-04-13 LAB — IRON AND TIBC
Saturation Ratios: 11 % — ABNORMAL LOW (ref 20–55)
TIBC: 167 ug/dL — ABNORMAL LOW (ref 250–470)
UIBC: 149 ug/dL

## 2010-04-13 LAB — VITAMIN B12: Vitamin B-12: 757 pg/mL (ref 211–911)

## 2010-04-13 LAB — FERRITIN: Ferritin: 222 ng/mL (ref 10–291)

## 2010-04-13 LAB — PROTIME-INR: Prothrombin Time: 25.4 seconds — ABNORMAL HIGH (ref 11.6–15.2)

## 2010-04-14 LAB — DIFFERENTIAL
Eosinophils Absolute: 0.1 10*3/uL (ref 0.0–0.7)
Eosinophils Relative: 2 % (ref 0–5)
Lymphs Abs: 0.9 10*3/uL (ref 0.7–4.0)
Monocytes Absolute: 0.7 10*3/uL (ref 0.1–1.0)

## 2010-04-14 LAB — CBC
HCT: 30.5 % — ABNORMAL LOW (ref 36.0–46.0)
MCV: 80.5 fL (ref 78.0–100.0)
Platelets: 271 10*3/uL (ref 150–400)
RDW: 14.5 % (ref 11.5–15.5)

## 2010-04-14 LAB — CULTURE, BLOOD (ROUTINE X 2)

## 2010-04-14 LAB — TSH: TSH: 1.323 u[IU]/mL (ref 0.350–4.500)

## 2010-04-14 LAB — GLUCOSE, CAPILLARY
Glucose-Capillary: 166 mg/dL — ABNORMAL HIGH (ref 70–99)
Glucose-Capillary: 193 mg/dL — ABNORMAL HIGH (ref 70–99)
Glucose-Capillary: 30 mg/dL — CL (ref 70–99)
Glucose-Capillary: 46 mg/dL — ABNORMAL LOW (ref 70–99)
Glucose-Capillary: 47 mg/dL — ABNORMAL LOW (ref 70–99)
Glucose-Capillary: 99 mg/dL (ref 70–99)

## 2010-04-14 LAB — BASIC METABOLIC PANEL
BUN: 36 mg/dL — ABNORMAL HIGH (ref 6–23)
Chloride: 103 mEq/L (ref 96–112)
Glucose, Bld: 115 mg/dL — ABNORMAL HIGH (ref 70–99)
Potassium: 3.6 mEq/L (ref 3.5–5.1)

## 2010-04-14 LAB — PROTIME-INR
INR: 3.45 — ABNORMAL HIGH (ref 0.00–1.49)
Prothrombin Time: 34.5 seconds — ABNORMAL HIGH (ref 11.6–15.2)

## 2010-04-14 LAB — POCT CARDIAC MARKERS
CKMB, poc: 2.2 ng/mL (ref 1.0–8.0)
Myoglobin, poc: 428 ng/mL (ref 12–200)

## 2010-04-14 LAB — HEMOGLOBIN A1C: Hgb A1c MFr Bld: 7 % — ABNORMAL HIGH (ref <5.7)

## 2010-04-18 ENCOUNTER — Other Ambulatory Visit: Payer: Self-pay | Admitting: Geriatric Medicine

## 2010-04-26 ENCOUNTER — Other Ambulatory Visit: Payer: Self-pay | Admitting: Geriatric Medicine

## 2010-04-27 ENCOUNTER — Inpatient Hospital Stay: Payer: Self-pay | Admitting: Internal Medicine

## 2010-11-18 ENCOUNTER — Other Ambulatory Visit: Payer: Self-pay | Admitting: Geriatric Medicine

## 2011-02-06 ENCOUNTER — Other Ambulatory Visit: Payer: Self-pay | Admitting: Geriatric Medicine

## 2011-02-10 LAB — URINE CULTURE

## 2011-02-16 ENCOUNTER — Emergency Department: Payer: Self-pay | Admitting: Emergency Medicine

## 2011-02-16 LAB — COMPREHENSIVE METABOLIC PANEL
Albumin: 3 g/dL — ABNORMAL LOW (ref 3.4–5.0)
Alkaline Phosphatase: 116 U/L (ref 50–136)
Anion Gap: 11 (ref 7–16)
BUN: 26 mg/dL — ABNORMAL HIGH (ref 7–18)
Bilirubin,Total: 0.4 mg/dL (ref 0.2–1.0)
Calcium, Total: 9.4 mg/dL (ref 8.5–10.1)
Glucose: 210 mg/dL — ABNORMAL HIGH (ref 65–99)
Potassium: 4.4 mmol/L (ref 3.5–5.1)
SGOT(AST): 17 U/L (ref 15–37)
Total Protein: 7.7 g/dL (ref 6.4–8.2)

## 2011-02-16 LAB — URINALYSIS, COMPLETE
Bilirubin,UR: NEGATIVE
Nitrite: NEGATIVE
Protein: 100
Specific Gravity: 1.011 (ref 1.003–1.030)
Squamous Epithelial: <1
WBC UR: 52 /HPF (ref 0–5)

## 2011-02-16 LAB — CBC
HGB: 11 g/dL — ABNORMAL LOW (ref 12.0–16.0)
MCH: 23.1 pg — ABNORMAL LOW (ref 26.0–34.0)
MCHC: 32.1 g/dL (ref 32.0–36.0)
Platelet: 291 10*3/uL (ref 150–440)
RDW: 19.4 % — ABNORMAL HIGH (ref 11.5–14.5)

## 2011-02-17 LAB — URINE CULTURE

## 2011-03-05 ENCOUNTER — Other Ambulatory Visit: Payer: Self-pay | Admitting: Geriatric Medicine

## 2011-03-05 LAB — COMPREHENSIVE METABOLIC PANEL
Anion Gap: 11 (ref 7–16)
BUN: 25 mg/dL — ABNORMAL HIGH (ref 7–18)
Bilirubin,Total: 0.5 mg/dL (ref 0.2–1.0)
Chloride: 110 mmol/L — ABNORMAL HIGH (ref 98–107)
Co2: 26 mmol/L (ref 21–32)
Creatinine: 1.98 mg/dL — ABNORMAL HIGH (ref 0.60–1.30)
EGFR (African American): 32 — ABNORMAL LOW
EGFR (Non-African Amer.): 26 — ABNORMAL LOW
Osmolality: 297 (ref 275–301)
Potassium: 4.4 mmol/L (ref 3.5–5.1)
SGPT (ALT): 15 U/L
Sodium: 147 mmol/L — ABNORMAL HIGH (ref 136–145)

## 2011-03-05 LAB — CBC WITH DIFFERENTIAL/PLATELET
Basophil #: 0.1 10*3/uL (ref 0.0–0.1)
Basophil %: 1.4 %
Eosinophil %: 1.8 %
HGB: 11 g/dL — ABNORMAL LOW (ref 12.0–16.0)
Lymphocyte #: 0.9 10*3/uL — ABNORMAL LOW (ref 1.0–3.6)
Lymphocyte %: 13.1 %
MCHC: 31.8 g/dL — ABNORMAL LOW (ref 32.0–36.0)
MCV: 74 fL — ABNORMAL LOW (ref 80–100)
Monocyte %: 5.8 %
Neutrophil #: 5.6 10*3/uL (ref 1.4–6.5)
RBC: 4.65 10*6/uL (ref 3.80–5.20)
WBC: 7.1 10*3/uL (ref 3.6–11.0)

## 2011-03-06 ENCOUNTER — Emergency Department: Payer: Self-pay | Admitting: Emergency Medicine

## 2011-03-06 LAB — PRO B NATRIURETIC PEPTIDE: B-Type Natriuretic Peptide: 864 pg/mL — ABNORMAL HIGH (ref 0–125)

## 2011-03-06 LAB — URINALYSIS, COMPLETE
Bilirubin,UR: NEGATIVE
Ketone: NEGATIVE
Nitrite: NEGATIVE
Protein: 100
RBC,UR: 6 /HPF (ref 0–5)
Specific Gravity: 1.011 (ref 1.003–1.030)
Squamous Epithelial: 1
WBC UR: 17 /HPF (ref 0–5)

## 2011-03-06 LAB — CBC
MCHC: 32.4 g/dL (ref 32.0–36.0)
MCV: 75 fL — ABNORMAL LOW (ref 80–100)
Platelet: 261 10*3/uL (ref 150–440)
RBC: 4.14 10*6/uL (ref 3.80–5.20)
RDW: 20.5 % — ABNORMAL HIGH (ref 11.5–14.5)
WBC: 5.8 10*3/uL (ref 3.6–11.0)

## 2011-03-06 LAB — PROTIME-INR
INR: 1.6
Prothrombin Time: 19.5 secs — ABNORMAL HIGH (ref 11.5–14.7)

## 2011-03-06 LAB — TSH: Thyroid Stimulating Horm: 1.95 u[IU]/mL

## 2011-03-06 LAB — COMPREHENSIVE METABOLIC PANEL
Albumin: 2.9 g/dL — ABNORMAL LOW (ref 3.4–5.0)
Alkaline Phosphatase: 89 U/L (ref 50–136)
Bilirubin,Total: 0.5 mg/dL (ref 0.2–1.0)
Calcium, Total: 9.4 mg/dL (ref 8.5–10.1)
Co2: 26 mmol/L (ref 21–32)
Creatinine: 1.95 mg/dL — ABNORMAL HIGH (ref 0.60–1.30)
Glucose: 87 mg/dL (ref 65–99)
Osmolality: 292 (ref 275–301)
Potassium: 3.7 mmol/L (ref 3.5–5.1)
Sodium: 145 mmol/L (ref 136–145)
Total Protein: 7.1 g/dL (ref 6.4–8.2)

## 2011-03-06 LAB — TROPONIN I
Troponin-I: <0.02 ng/mL
Troponin-I: <0.02 ng/mL

## 2011-03-08 LAB — URINE CULTURE

## 2011-05-01 ENCOUNTER — Other Ambulatory Visit: Payer: Self-pay | Admitting: Geriatric Medicine

## 2011-05-01 LAB — URINALYSIS, COMPLETE
Ketone: NEGATIVE
Ph: 7 (ref 4.5–8.0)
Protein: 100
RBC,UR: 3 /HPF (ref 0–5)
Specific Gravity: 1.015 (ref 1.003–1.030)
Squamous Epithelial: NONE SEEN
WBC UR: 22 /HPF (ref 0–5)

## 2011-05-01 LAB — PROTIME-INR
INR: 1.7
Prothrombin Time: 20.4 secs — ABNORMAL HIGH (ref 11.5–14.7)

## 2011-05-04 LAB — URINE CULTURE

## 2011-05-08 ENCOUNTER — Other Ambulatory Visit: Payer: Self-pay | Admitting: Geriatric Medicine

## 2011-05-08 LAB — PROTIME-INR: Prothrombin Time: 19.6 secs — ABNORMAL HIGH (ref 11.5–14.7)

## 2011-07-02 ENCOUNTER — Other Ambulatory Visit: Payer: Self-pay | Admitting: Geriatric Medicine

## 2011-07-02 LAB — PROTIME-INR
INR: 1.7
Prothrombin Time: 20 s — ABNORMAL HIGH

## 2011-07-31 ENCOUNTER — Other Ambulatory Visit: Payer: Self-pay | Admitting: Geriatric Medicine

## 2011-07-31 LAB — URINALYSIS, COMPLETE
Glucose,UR: NEGATIVE mg/dL (ref 0–75)
Ph: 7 (ref 4.5–8.0)
Protein: 100

## 2011-09-04 ENCOUNTER — Other Ambulatory Visit: Payer: Self-pay | Admitting: Geriatric Medicine

## 2011-09-04 LAB — URINALYSIS, COMPLETE
Ketone: NEGATIVE
Nitrite: NEGATIVE
Ph: 7 (ref 4.5–8.0)
Protein: 100
RBC,UR: NONE SEEN /HPF (ref 0–5)
WBC UR: 273 /HPF (ref 0–5)

## 2011-09-04 LAB — PROTIME-INR: INR: 1.9

## 2011-09-15 ENCOUNTER — Other Ambulatory Visit: Payer: Self-pay | Admitting: Geriatric Medicine

## 2011-09-15 LAB — PROTIME-INR: INR: 2.7

## 2011-10-05 ENCOUNTER — Other Ambulatory Visit: Payer: Self-pay | Admitting: Geriatric Medicine

## 2011-10-05 LAB — PROTIME-INR: INR: 2

## 2011-10-21 ENCOUNTER — Other Ambulatory Visit: Payer: Self-pay | Admitting: Geriatric Medicine

## 2011-10-29 ENCOUNTER — Ambulatory Visit: Payer: Self-pay | Admitting: Geriatric Medicine

## 2011-10-30 LAB — URINALYSIS, COMPLETE
Bilirubin,UR: NEGATIVE
Ketone: NEGATIVE
Protein: 25
RBC,UR: 70 /HPF (ref 0–5)
Specific Gravity: 1.005 (ref 1.003–1.030)
Squamous Epithelial: 1
WBC UR: 19 /HPF (ref 0–5)

## 2011-11-02 ENCOUNTER — Other Ambulatory Visit: Payer: Self-pay | Admitting: Geriatric Medicine

## 2011-11-02 LAB — PROTIME-INR: INR: 2.2

## 2011-11-02 LAB — URINE CULTURE

## 2011-12-10 ENCOUNTER — Other Ambulatory Visit: Payer: Self-pay

## 2011-12-10 LAB — URINALYSIS, COMPLETE
Bilirubin,UR: NEGATIVE
Glucose,UR: NEGATIVE mg/dL (ref 0–75)
Ketone: NEGATIVE
RBC,UR: 18 /HPF (ref 0–5)
Transitional Epi: <1

## 2011-12-13 LAB — URINE CULTURE

## 2012-01-14 ENCOUNTER — Other Ambulatory Visit: Payer: Self-pay | Admitting: Geriatric Medicine

## 2012-01-14 LAB — PROTIME-INR
INR: 2.2
Prothrombin Time: 24.9 secs — ABNORMAL HIGH (ref 11.5–14.7)

## 2012-02-15 ENCOUNTER — Ambulatory Visit: Payer: Self-pay | Admitting: Geriatric Medicine

## 2012-02-16 LAB — PROTIME-INR: Prothrombin Time: 26.4 secs — ABNORMAL HIGH (ref 11.5–14.7)

## 2012-03-03 ENCOUNTER — Ambulatory Visit: Payer: Self-pay

## 2012-03-03 ENCOUNTER — Other Ambulatory Visit: Payer: Self-pay

## 2012-03-04 LAB — PROTIME-INR
INR: 1.6
Prothrombin Time: 19.8 s — ABNORMAL HIGH (ref 11.5–14.7)

## 2012-03-12 ENCOUNTER — Other Ambulatory Visit: Payer: Self-pay

## 2012-03-12 LAB — URINALYSIS, COMPLETE
Bilirubin,UR: NEGATIVE
Glucose,UR: NEGATIVE mg/dL (ref 0–75)
Nitrite: POSITIVE
Ph: 5 (ref 4.5–8.0)
Protein: 100
RBC,UR: 7 /HPF (ref 0–5)
Specific Gravity: 1.013 (ref 1.003–1.030)

## 2012-03-20 ENCOUNTER — Other Ambulatory Visit: Payer: Self-pay | Admitting: Pediatrics

## 2012-03-20 LAB — PROTIME-INR
INR: 5.5
Prothrombin Time: 47.2 secs — ABNORMAL HIGH (ref 11.5–14.7)

## 2012-03-25 ENCOUNTER — Other Ambulatory Visit: Payer: Self-pay

## 2012-03-25 ENCOUNTER — Ambulatory Visit: Payer: Self-pay | Admitting: Internal Medicine

## 2012-03-25 LAB — PROTIME-INR
INR: 1.6
Prothrombin Time: 19.3 secs — ABNORMAL HIGH (ref 11.5–14.7)

## 2012-04-08 ENCOUNTER — Inpatient Hospital Stay: Payer: Self-pay | Admitting: Internal Medicine

## 2012-04-08 LAB — CK TOTAL AND CKMB (NOT AT ARMC)
CK, Total: 250 U/L — ABNORMAL HIGH (ref 21–215)
CK-MB: 1.2 ng/mL (ref 0.5–3.6)

## 2012-04-08 LAB — URINALYSIS, COMPLETE
Bilirubin,UR: NEGATIVE
Blood: NEGATIVE
Ketone: NEGATIVE
Leukocyte Esterase: NEGATIVE
Ph: 7 (ref 4.5–8.0)
Protein: NEGATIVE
RBC,UR: 1 /HPF (ref 0–5)
Specific Gravity: 1.001 (ref 1.003–1.030)

## 2012-04-08 LAB — CBC WITH DIFFERENTIAL/PLATELET
Basophil %: 0.3 %
Eosinophil %: 0.1 %
HCT: 31.2 % — ABNORMAL LOW (ref 35.0–47.0)
Lymphocyte #: 0.7 10*3/uL — ABNORMAL LOW (ref 1.0–3.6)
MCHC: 31.5 g/dL — ABNORMAL LOW (ref 32.0–36.0)
MCV: 75 fL — ABNORMAL LOW (ref 80–100)
Monocyte #: 0.6 x10 3/mm (ref 0.2–0.9)
Neutrophil %: 89.9 %
Platelet: 314 10*3/uL (ref 150–440)
RBC: 4.18 10*6/uL (ref 3.80–5.20)
RDW: 17 % — ABNORMAL HIGH (ref 11.5–14.5)
WBC: 13.2 10*3/uL — ABNORMAL HIGH (ref 3.6–11.0)

## 2012-04-08 LAB — COMPREHENSIVE METABOLIC PANEL
Albumin: 2.8 g/dL — ABNORMAL LOW (ref 3.4–5.0)
Alkaline Phosphatase: 116 U/L (ref 50–136)
Anion Gap: 6 — ABNORMAL LOW (ref 7–16)
BUN: 59 mg/dL — ABNORMAL HIGH (ref 7–18)
Calcium, Total: 9.6 mg/dL (ref 8.5–10.1)
Chloride: 114 mmol/L — ABNORMAL HIGH (ref 98–107)
Creatinine: 2.24 mg/dL — ABNORMAL HIGH (ref 0.60–1.30)
Glucose: 106 mg/dL — ABNORMAL HIGH (ref 65–99)
Osmolality: 304 (ref 275–301)
SGPT (ALT): 37 U/L (ref 12–78)
Sodium: 144 mmol/L (ref 136–145)
Total Protein: 7.4 g/dL (ref 6.4–8.2)

## 2012-04-08 LAB — PROTIME-INR
INR: 4.8
Prothrombin Time: 43.1 secs — ABNORMAL HIGH (ref 11.5–14.7)

## 2012-04-09 LAB — BASIC METABOLIC PANEL
Anion Gap: 10 (ref 7–16)
BUN: 60 mg/dL — ABNORMAL HIGH (ref 7–18)
Calcium, Total: 9 mg/dL (ref 8.5–10.1)
Chloride: 114 mmol/L — ABNORMAL HIGH (ref 98–107)
EGFR (Non-African Amer.): 16 — ABNORMAL LOW
Glucose: 240 mg/dL — ABNORMAL HIGH (ref 65–99)
Potassium: 3.6 mmol/L (ref 3.5–5.1)

## 2012-04-09 LAB — CBC WITH DIFFERENTIAL/PLATELET
Basophil #: 0.1 10*3/uL (ref 0.0–0.1)
HCT: 27.8 % — ABNORMAL LOW (ref 35.0–47.0)
HGB: 8.9 g/dL — ABNORMAL LOW (ref 12.0–16.0)
Lymphocyte #: 0.9 10*3/uL — ABNORMAL LOW (ref 1.0–3.6)
MCH: 24.2 pg — ABNORMAL LOW (ref 26.0–34.0)
Monocyte #: 0.4 x10 3/mm (ref 0.2–0.9)
Neutrophil %: 82.5 %
Platelet: 278 10*3/uL (ref 150–440)
RDW: 17.1 % — ABNORMAL HIGH (ref 11.5–14.5)
WBC: 8.4 10*3/uL (ref 3.6–11.0)

## 2012-04-09 LAB — PROTIME-INR: INR: 6.3

## 2012-04-09 LAB — MAGNESIUM: Magnesium: 2.8 mg/dL — ABNORMAL HIGH

## 2012-04-10 LAB — CBC WITH DIFFERENTIAL/PLATELET
Basophil #: 0.1 10*3/uL (ref 0.0–0.1)
Basophil %: 0.9 %
Eosinophil %: 1.8 %
HCT: 28.4 % — ABNORMAL LOW (ref 35.0–47.0)
Lymphocyte #: 0.7 10*3/uL — ABNORMAL LOW (ref 1.0–3.6)
Lymphocyte %: 8.6 %
Monocyte #: 0.4 x10 3/mm (ref 0.2–0.9)
Neutrophil #: 6.6 10*3/uL — ABNORMAL HIGH (ref 1.4–6.5)
Neutrophil %: 83.9 %
Platelet: 271 10*3/uL (ref 150–440)
WBC: 7.9 10*3/uL (ref 3.6–11.0)

## 2012-04-10 LAB — PROTIME-INR: INR: 6.8

## 2012-04-10 LAB — BASIC METABOLIC PANEL
Anion Gap: 9 (ref 7–16)
BUN: 49 mg/dL — ABNORMAL HIGH (ref 7–18)
Creatinine: 2.35 mg/dL — ABNORMAL HIGH (ref 0.60–1.30)
EGFR (African American): 23 — ABNORMAL LOW
Glucose: 233 mg/dL — ABNORMAL HIGH (ref 65–99)
Osmolality: 315 (ref 275–301)
Potassium: 3.5 mmol/L (ref 3.5–5.1)
Sodium: 148 mmol/L — ABNORMAL HIGH (ref 136–145)

## 2012-04-11 LAB — CBC WITH DIFFERENTIAL/PLATELET
Basophil #: 0.1 10*3/uL (ref 0.0–0.1)
Eosinophil #: 0.1 10*3/uL (ref 0.0–0.7)
Eosinophil %: 1.6 %
HCT: 29.6 % — ABNORMAL LOW (ref 35.0–47.0)
HGB: 9.4 g/dL — ABNORMAL LOW (ref 12.0–16.0)
Lymphocyte #: 1 10*3/uL (ref 1.0–3.6)
MCH: 23.8 pg — ABNORMAL LOW (ref 26.0–34.0)
Monocyte #: 0.5 x10 3/mm (ref 0.2–0.9)
Monocyte %: 5.9 %
Neutrophil %: 79.6 %
Platelet: 300 10*3/uL (ref 150–440)
RDW: 17.1 % — ABNORMAL HIGH (ref 11.5–14.5)

## 2012-04-11 LAB — BASIC METABOLIC PANEL
Anion Gap: 10 (ref 7–16)
BUN: 35 mg/dL — ABNORMAL HIGH (ref 7–18)
Calcium, Total: 8.6 mg/dL (ref 8.5–10.1)
Co2: 21 mmol/L (ref 21–32)
Creatinine: 1.99 mg/dL — ABNORMAL HIGH (ref 0.60–1.30)
EGFR (African American): 28 — ABNORMAL LOW
Osmolality: 313 (ref 275–301)
Sodium: 150 mmol/L — ABNORMAL HIGH (ref 136–145)

## 2012-04-11 LAB — PROTIME-INR: INR: 6.3

## 2012-04-12 LAB — BASIC METABOLIC PANEL
BUN: 28 mg/dL — ABNORMAL HIGH (ref 7–18)
Calcium, Total: 8.4 mg/dL — ABNORMAL LOW (ref 8.5–10.1)
Chloride: 117 mmol/L — ABNORMAL HIGH (ref 98–107)
Co2: 24 mmol/L (ref 21–32)
Creatinine: 1.94 mg/dL — ABNORMAL HIGH (ref 0.60–1.30)
EGFR (Non-African Amer.): 25 — ABNORMAL LOW
Osmolality: 311 (ref 275–301)
Potassium: 3.4 mmol/L — ABNORMAL LOW (ref 3.5–5.1)
Sodium: 147 mmol/L — ABNORMAL HIGH (ref 136–145)

## 2012-04-12 LAB — IRON AND TIBC
Iron Bind.Cap.(Total): 199 ug/dL — ABNORMAL LOW (ref 250–450)
Iron Saturation: 44 %
Iron: 87 ug/dL (ref 50–170)
Unbound Iron-Bind.Cap.: 112 ug/dL

## 2012-04-12 LAB — CBC WITH DIFFERENTIAL/PLATELET
Basophil #: 0.1 10*3/uL (ref 0.0–0.1)
Basophil %: 1.4 %
Eosinophil #: 0.1 10*3/uL (ref 0.0–0.7)
HGB: 8.5 g/dL — ABNORMAL LOW (ref 12.0–16.0)
Lymphocyte #: 1.1 10*3/uL (ref 1.0–3.6)
Lymphocyte %: 15.6 %
MCHC: 32.4 g/dL (ref 32.0–36.0)
MCV: 75 fL — ABNORMAL LOW (ref 80–100)
Monocyte #: 0.6 x10 3/mm (ref 0.2–0.9)
Monocyte %: 8.7 %
Neutrophil #: 5 10*3/uL (ref 1.4–6.5)
RBC: 3.51 10*6/uL — ABNORMAL LOW (ref 3.80–5.20)
RDW: 17.1 % — ABNORMAL HIGH (ref 11.5–14.5)

## 2012-04-12 LAB — PROTIME-INR
INR: 4.3
Prothrombin Time: 39.4 secs — ABNORMAL HIGH (ref 11.5–14.7)

## 2012-04-12 LAB — FOLATE: Folic Acid: 37 ng/mL (ref 3.1–100.0)

## 2012-04-13 LAB — CBC WITH DIFFERENTIAL/PLATELET
Basophil #: 0.1 10*3/uL (ref 0.0–0.1)
Basophil %: 1.1 %
Eosinophil #: 0.1 10*3/uL (ref 0.0–0.7)
HCT: 25.4 % — ABNORMAL LOW (ref 35.0–47.0)
Lymphocyte %: 17.8 %
MCHC: 32.9 g/dL (ref 32.0–36.0)
Monocyte #: 0.7 x10 3/mm (ref 0.2–0.9)
Monocyte %: 9.1 %
Neutrophil %: 70.6 %
Platelet: 250 10*3/uL (ref 150–440)
RBC: 3.43 10*6/uL — ABNORMAL LOW (ref 3.80–5.20)
RDW: 16.9 % — ABNORMAL HIGH (ref 11.5–14.5)
WBC: 7.7 10*3/uL (ref 3.6–11.0)

## 2012-04-13 LAB — BASIC METABOLIC PANEL
Anion Gap: 7 (ref 7–16)
BUN: 25 mg/dL — ABNORMAL HIGH (ref 7–18)
Chloride: 116 mmol/L — ABNORMAL HIGH (ref 98–107)
Co2: 25 mmol/L (ref 21–32)
Creatinine: 1.88 mg/dL — ABNORMAL HIGH (ref 0.60–1.30)
EGFR (African American): 30 — ABNORMAL LOW
Glucose: 302 mg/dL — ABNORMAL HIGH (ref 65–99)
Osmolality: 310 (ref 275–301)
Potassium: 3.4 mmol/L — ABNORMAL LOW (ref 3.5–5.1)

## 2012-04-13 LAB — PROTIME-INR: INR: 3

## 2012-04-14 LAB — PROTIME-INR: INR: 2.9

## 2012-04-25 ENCOUNTER — Ambulatory Visit: Payer: Self-pay | Admitting: Internal Medicine

## 2012-04-26 ENCOUNTER — Other Ambulatory Visit: Payer: Self-pay | Admitting: Family Medicine

## 2012-04-26 LAB — URINALYSIS, COMPLETE
Glucose,UR: NEGATIVE mg/dL (ref 0–75)
Ketone: NEGATIVE
Ph: 8 (ref 4.5–8.0)
WBC UR: 12 /HPF (ref 0–5)

## 2012-04-26 LAB — CBC WITH DIFFERENTIAL/PLATELET
Basophil #: 0.1 10*3/uL (ref 0.0–0.1)
Basophil %: 1 %
Eosinophil #: 0.1 10*3/uL (ref 0.0–0.7)
Eosinophil %: 1.3 %
HCT: 28 % — ABNORMAL LOW (ref 35.0–47.0)
Lymphocyte #: 1 10*3/uL (ref 1.0–3.6)
MCH: 25.2 pg — ABNORMAL LOW (ref 26.0–34.0)
Monocyte #: 0.7 x10 3/mm (ref 0.2–0.9)
Neutrophil #: 5.8 10*3/uL (ref 1.4–6.5)
Platelet: 318 10*3/uL (ref 150–440)
RBC: 3.7 10*6/uL — ABNORMAL LOW (ref 3.80–5.20)
RDW: 18.3 % — ABNORMAL HIGH (ref 11.5–14.5)
WBC: 7.6 10*3/uL (ref 3.6–11.0)

## 2012-04-26 LAB — BASIC METABOLIC PANEL
Anion Gap: 6 — ABNORMAL LOW (ref 7–16)
BUN: 21 mg/dL — ABNORMAL HIGH (ref 7–18)
Chloride: 104 mmol/L (ref 98–107)
Co2: 29 mmol/L (ref 21–32)
Creatinine: 1.98 mg/dL — ABNORMAL HIGH (ref 0.60–1.30)
EGFR (African American): 28 — ABNORMAL LOW
Sodium: 139 mmol/L (ref 136–145)

## 2012-06-17 ENCOUNTER — Other Ambulatory Visit: Payer: Self-pay

## 2012-06-17 LAB — COMPREHENSIVE METABOLIC PANEL
BUN: 31 mg/dL — ABNORMAL HIGH (ref 7–18)
Bilirubin,Total: 0.3 mg/dL (ref 0.2–1.0)
Calcium, Total: 9.7 mg/dL (ref 8.5–10.1)
Chloride: 104 mmol/L (ref 98–107)
EGFR (Non-African Amer.): 19 — ABNORMAL LOW
SGOT(AST): 13 U/L — ABNORMAL LOW (ref 15–37)
Total Protein: 6.8 g/dL (ref 6.4–8.2)

## 2012-06-17 LAB — CBC WITH DIFFERENTIAL/PLATELET
Basophil #: 0.1 10*3/uL (ref 0.0–0.1)
Basophil %: 0.9 %
HCT: 30.1 % — ABNORMAL LOW (ref 35.0–47.0)
HGB: 9.8 g/dL — ABNORMAL LOW (ref 12.0–16.0)
MCH: 24 pg — ABNORMAL LOW (ref 26.0–34.0)
MCHC: 32.5 g/dL (ref 32.0–36.0)
MCV: 74 fL — ABNORMAL LOW (ref 80–100)
Monocyte %: 7 %
Neutrophil #: 5.8 10*3/uL (ref 1.4–6.5)
Neutrophil %: 73.5 %
RDW: 15.6 % — ABNORMAL HIGH (ref 11.5–14.5)

## 2012-06-25 ENCOUNTER — Other Ambulatory Visit: Payer: Self-pay

## 2012-10-25 ENCOUNTER — Other Ambulatory Visit: Payer: Self-pay | Admitting: Family Medicine

## 2012-10-25 LAB — BASIC METABOLIC PANEL
BUN: 33 mg/dL — ABNORMAL HIGH (ref 7–18)
Calcium, Total: 9.9 mg/dL (ref 8.5–10.1)
Co2: 28 mmol/L (ref 21–32)
EGFR (Non-African Amer.): 25 — ABNORMAL LOW
Glucose: 125 mg/dL — ABNORMAL HIGH (ref 65–99)
Sodium: 140 mmol/L (ref 136–145)

## 2012-10-25 LAB — URINALYSIS, COMPLETE
Bilirubin,UR: NEGATIVE
Blood: NEGATIVE
Glucose,UR: NEGATIVE mg/dL (ref 0–75)
Ketone: NEGATIVE
Nitrite: POSITIVE
Ph: 6 (ref 4.5–8.0)
Protein: >=500
RBC,UR: 14 /HPF (ref 0–5)
Specific Gravity: 1.012 (ref 1.003–1.030)
Squamous Epithelial: NONE SEEN
WBC UR: 177 /HPF (ref 0–5)

## 2012-10-25 LAB — CBC WITH DIFFERENTIAL/PLATELET
Basophil #: 0.1 10*3/uL (ref 0.0–0.1)
Basophil %: 1.2 %
Eosinophil #: 0.2 10*3/uL (ref 0.0–0.7)
Eosinophil %: 2.8 %
HCT: 35.1 % (ref 35.0–47.0)
HGB: 11.5 g/dL — ABNORMAL LOW (ref 12.0–16.0)
Lymphocyte #: 1.5 10*3/uL (ref 1.0–3.6)
Lymphocyte %: 25.2 %
MCH: 24.3 pg — ABNORMAL LOW (ref 26.0–34.0)
MCHC: 32.8 g/dL (ref 32.0–36.0)
MCV: 74 fL — ABNORMAL LOW (ref 80–100)
Monocyte #: 0.5 x10 3/mm (ref 0.2–0.9)
Monocyte %: 8.4 %
Neutrophil #: 3.6 10*3/uL (ref 1.4–6.5)
Neutrophil %: 62.4 %
Platelet: 274 10*3/uL (ref 150–440)
RBC: 4.75 10*6/uL (ref 3.80–5.20)
RDW: 16.1 % — ABNORMAL HIGH (ref 11.5–14.5)
WBC: 5.8 10*3/uL (ref 3.6–11.0)

## 2012-11-29 ENCOUNTER — Other Ambulatory Visit: Payer: Self-pay | Admitting: Family Medicine

## 2012-11-29 LAB — PROTIME-INR: INR: 2.4

## 2012-12-18 ENCOUNTER — Other Ambulatory Visit: Payer: Self-pay | Admitting: Family Medicine

## 2013-01-27 LAB — EXPECTORATED SPUTUM ASSESSMENT W GRAM STAIN, RFLX TO RESP C

## 2013-04-03 ENCOUNTER — Other Ambulatory Visit: Payer: Self-pay | Admitting: Family Medicine

## 2013-04-04 ENCOUNTER — Other Ambulatory Visit: Payer: Self-pay | Admitting: Family Medicine

## 2013-04-04 LAB — PROTIME-INR
INR: 2.4
Prothrombin Time: 25.4 secs — ABNORMAL HIGH (ref 11.5–14.7)

## 2013-04-10 ENCOUNTER — Other Ambulatory Visit: Payer: Self-pay | Admitting: Family Medicine

## 2013-04-10 LAB — PROTIME-INR
INR: 2.1
Prothrombin Time: 22.8 secs — ABNORMAL HIGH (ref 11.5–14.7)

## 2013-04-11 ENCOUNTER — Other Ambulatory Visit: Payer: Self-pay | Admitting: Family Medicine

## 2013-04-11 LAB — PROTIME-INR
INR: 2.3
PROTHROMBIN TIME: 24.4 s — AB (ref 11.5–14.7)

## 2013-05-04 ENCOUNTER — Other Ambulatory Visit: Payer: Self-pay | Admitting: Family Medicine

## 2013-08-15 IMAGING — CR DG CHEST 1V PORT
1 series · 1 of 1 positions shown · non-contrast
Comparison: none

REASON FOR EXAM: Chest pain
COMMENTS:

PROCEDURE:     DXR - DXR PORTABLE CHEST SINGLE VIEW  - April 08, 2012 [DATE]
RESULT:     Comparison: 03/06/2011

[x chest ap]
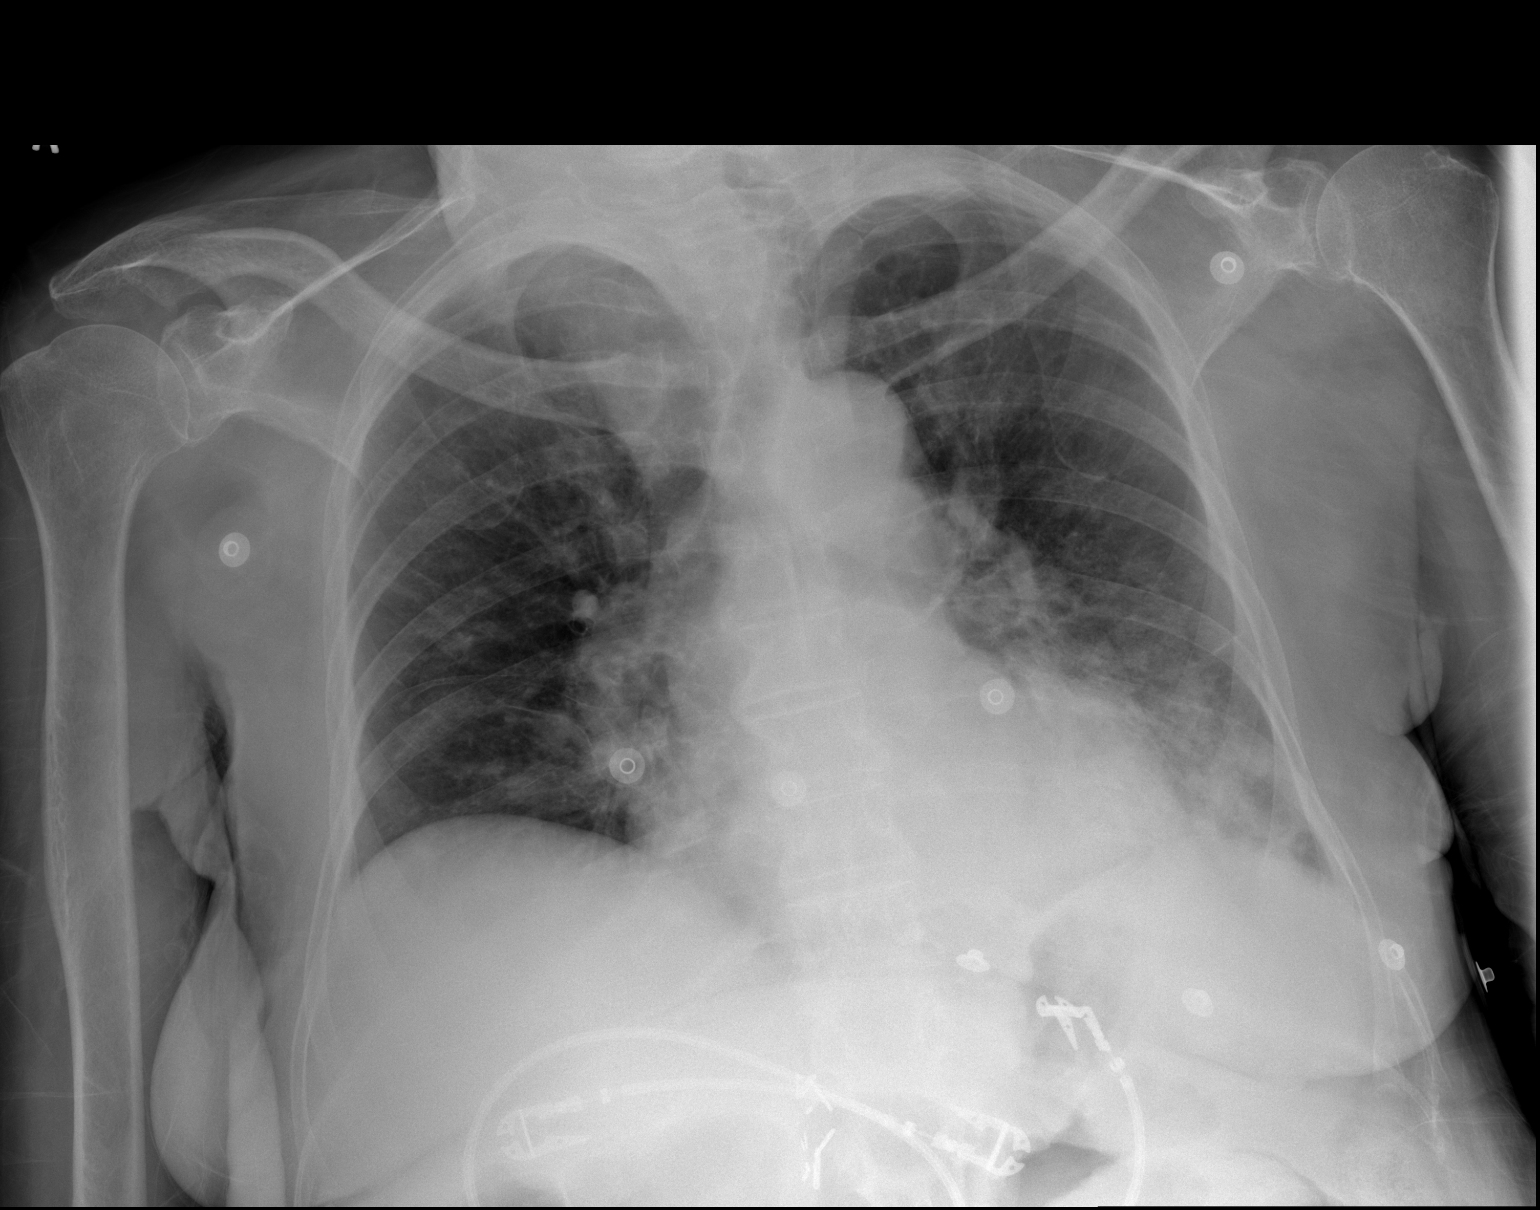

[1 of 1 positions shown; findings below may reference images not displayed]

FINDINGS: The heart and mediastinum are stable given patient rotation and low lung
volumes. There are heterogeneous opacities in the mid and lower left lung.
IMPRESSION: Heterogeneous opacities in the mid and lower left lung are concerning for
pneumonia. Followup PA and lateral chest radiograph is recommended to ensure
resolution.

## 2013-08-15 IMAGING — CT CT HEAD WITHOUT CONTRAST
3 series · 18 of 30 positions shown, 20 images · non-contrast
Comparison: none

REASON FOR EXAM: altered mental status
COMMENTS:

PROCEDURE:     CT  - CT HEAD WITHOUT CONTRAST  - April 08, 2012 [DATE]
RESULT:     Comparison:  04/26/2010
TECHNIQUE: Multiple axial images from the foramen magnum to the vertex were
obtained without IV contrast.

[Series 3: soft tissue · axial · 0.41mm/px · z∈[+466,+596]mm · 8 of 34 slices shown]
[im 4/34  brain]
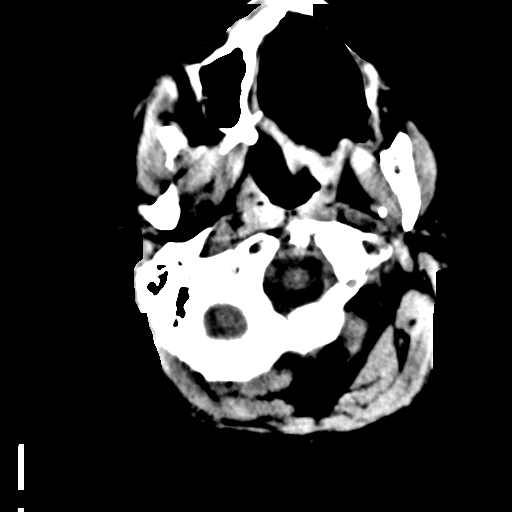
[im 7/34  brain]
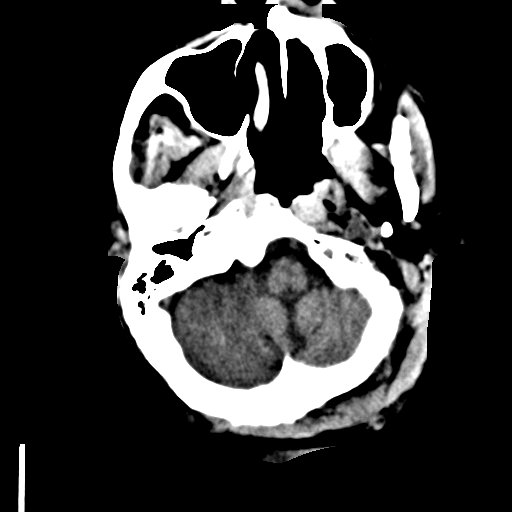
[im 10/34  brain]
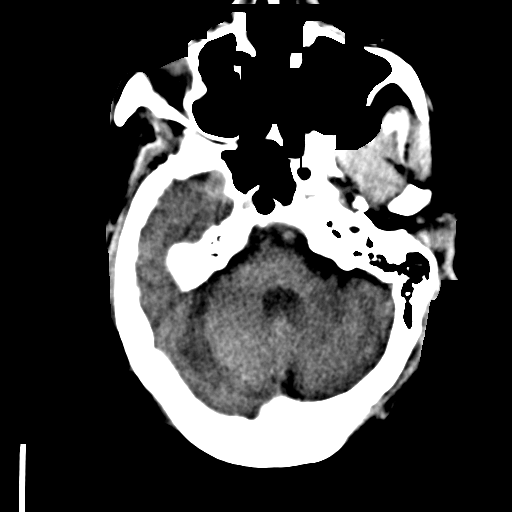
[im 14/34  brain]
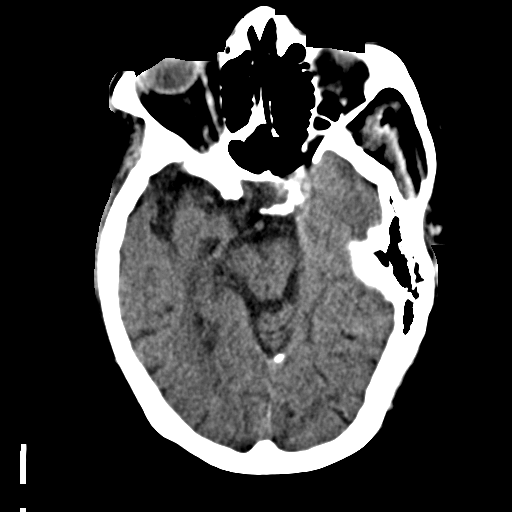
[im 20/34  brain]
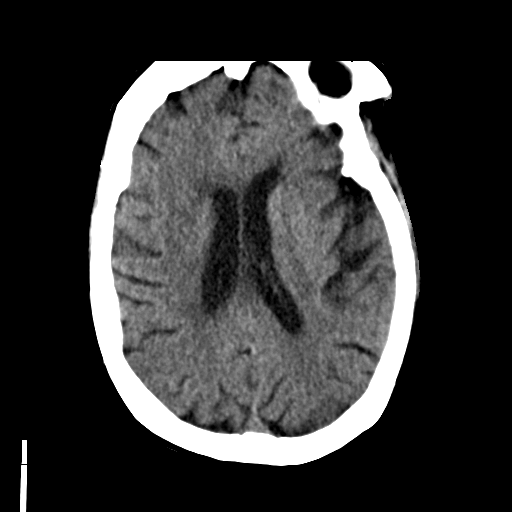
[im 24/34  brain]
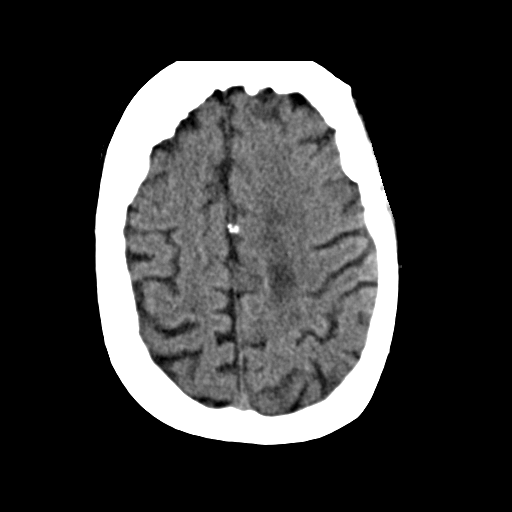
[im 27/34  brain]
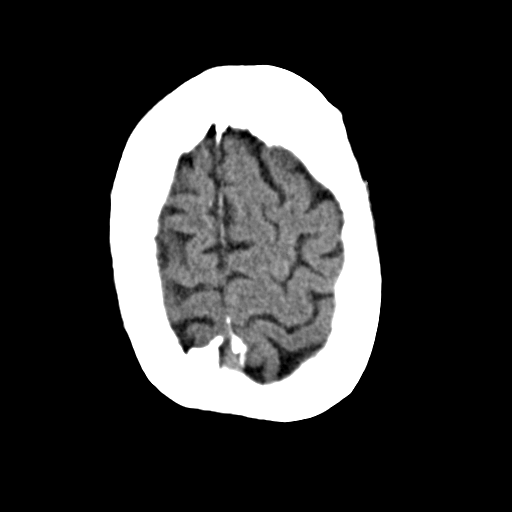
[im 30/34  brain]
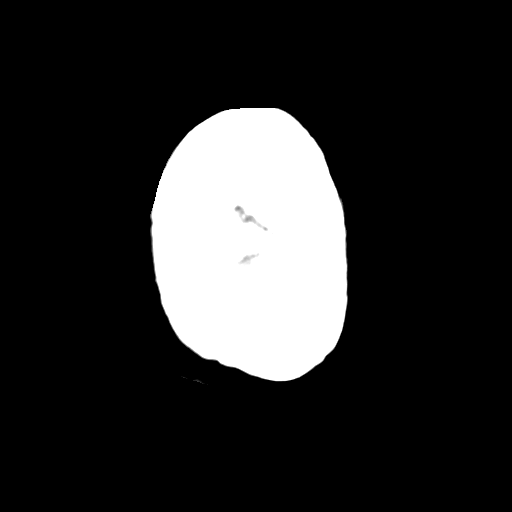

[Series 5: soft tissue recons · axial · 0.41mm/px · z∈[+453,+565]mm · 7 of 31 slices shown, 9 images]
[im 4/31  brain]
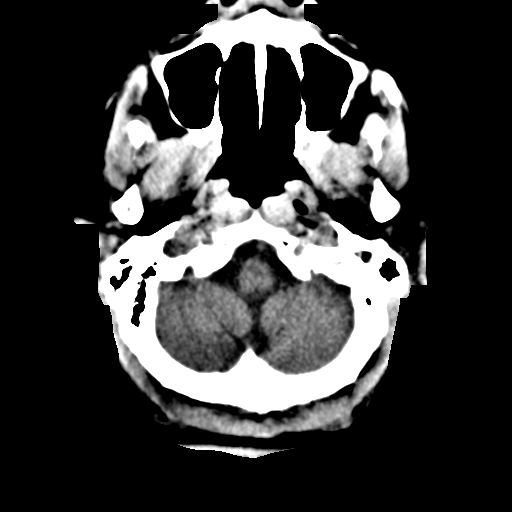
[im 4/31  bone]
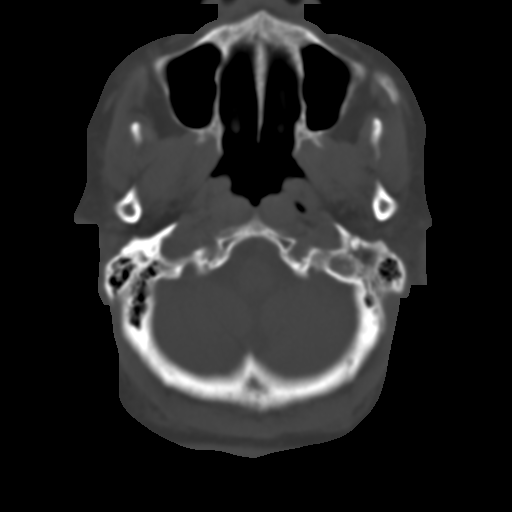
[im 8/31  brain]
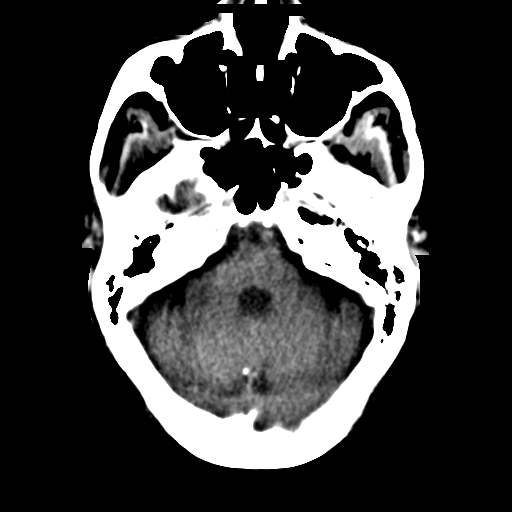
[im 12/31  brain]
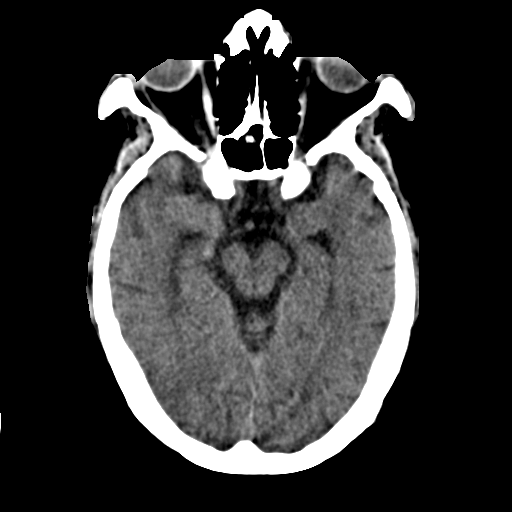
[im 16/31  brain]
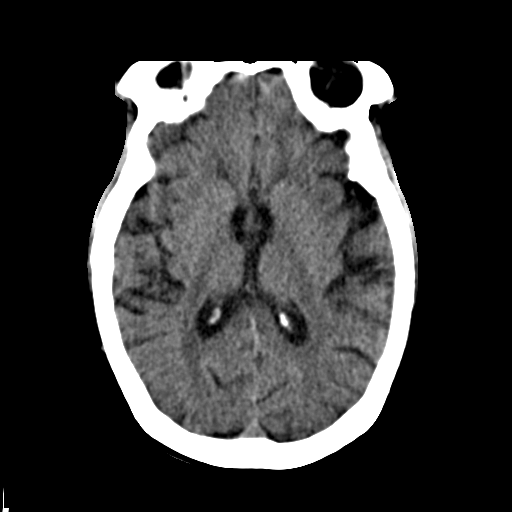
[im 19/31  brain]
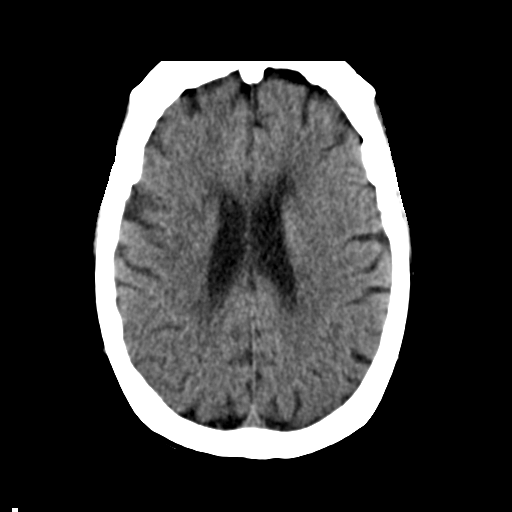
[im 19/31  bone]
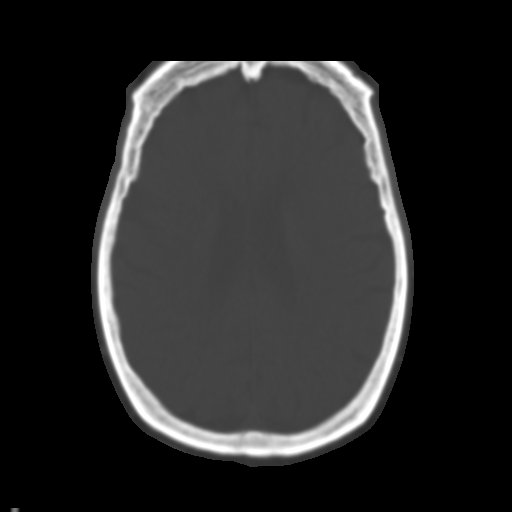
[im 23/31  brain]
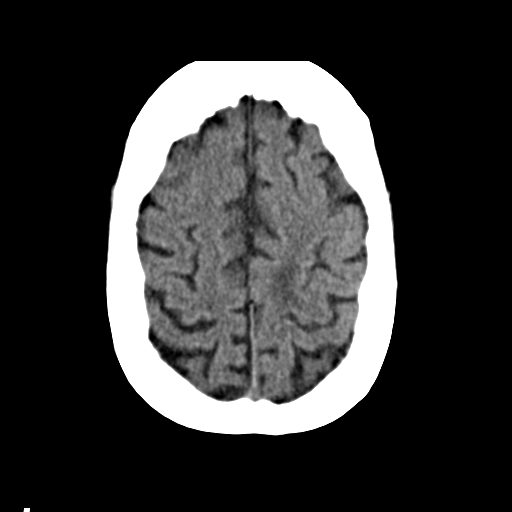
[im 27/31  brain]
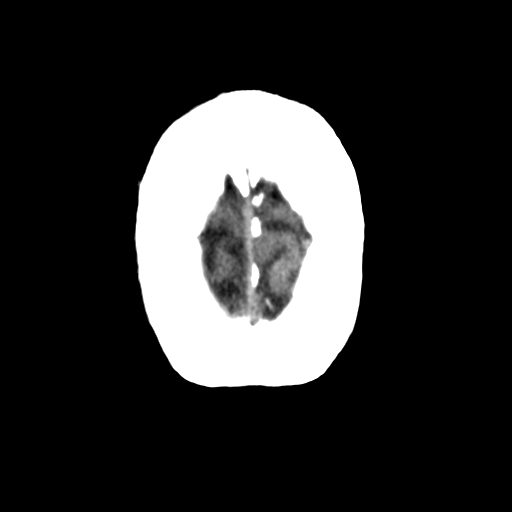

[Series 6: bone recons · axial · 0.41mm/px · z∈[+454,+488]mm · 3 of 33 slices shown]
[im 4/33  bone]
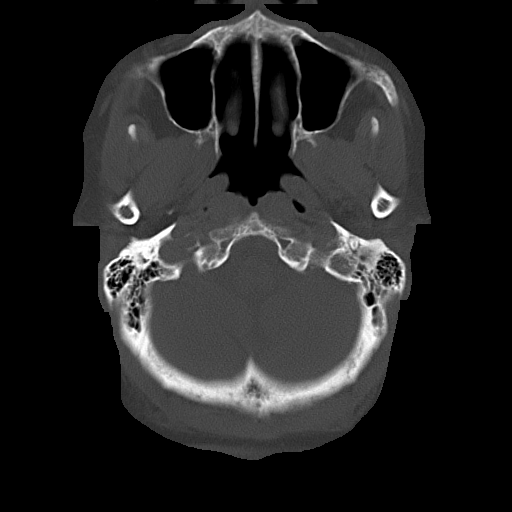
[im 8/33  bone]
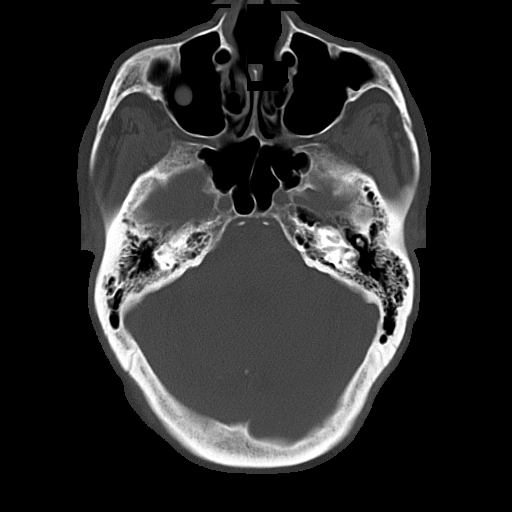
[im 11/33  bone]
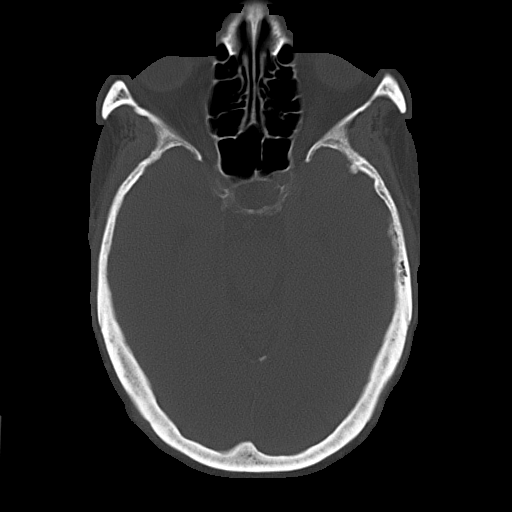

[18 of 30 positions shown; findings below may reference images not displayed]

FINDINGS: There is no evidence for mass effect, midline shift, or extra-axial fluid
collections. There is no evidence for space-occupying lesion, intracranial
hemorrhage, or cortical-based area of infarction. Mild periventricular and
subcortical hypoattenuation is likely sequela of chronic microangiopathy.

There is a small mucus retention cyst in the right maxillary sinus.

The osseous structures are unremarkable.
IMPRESSION: No acute intracranial process.

## 2013-08-16 IMAGING — CR DG CHEST 1V PORT
1 series · 2 of 2 positions shown · non-contrast
Comparison: none

REASON FOR EXAM: verify central line placement
COMMENTS:

PROCEDURE:     DXR - DXR PORTABLE CHEST SINGLE VIEW  - April 09, 2012 [DATE]
RESULT:     Comparison: 04/08/2012

[Series 1: ap · 0.17mm/px · 2 of 2 slices shown]
[im 1/2]
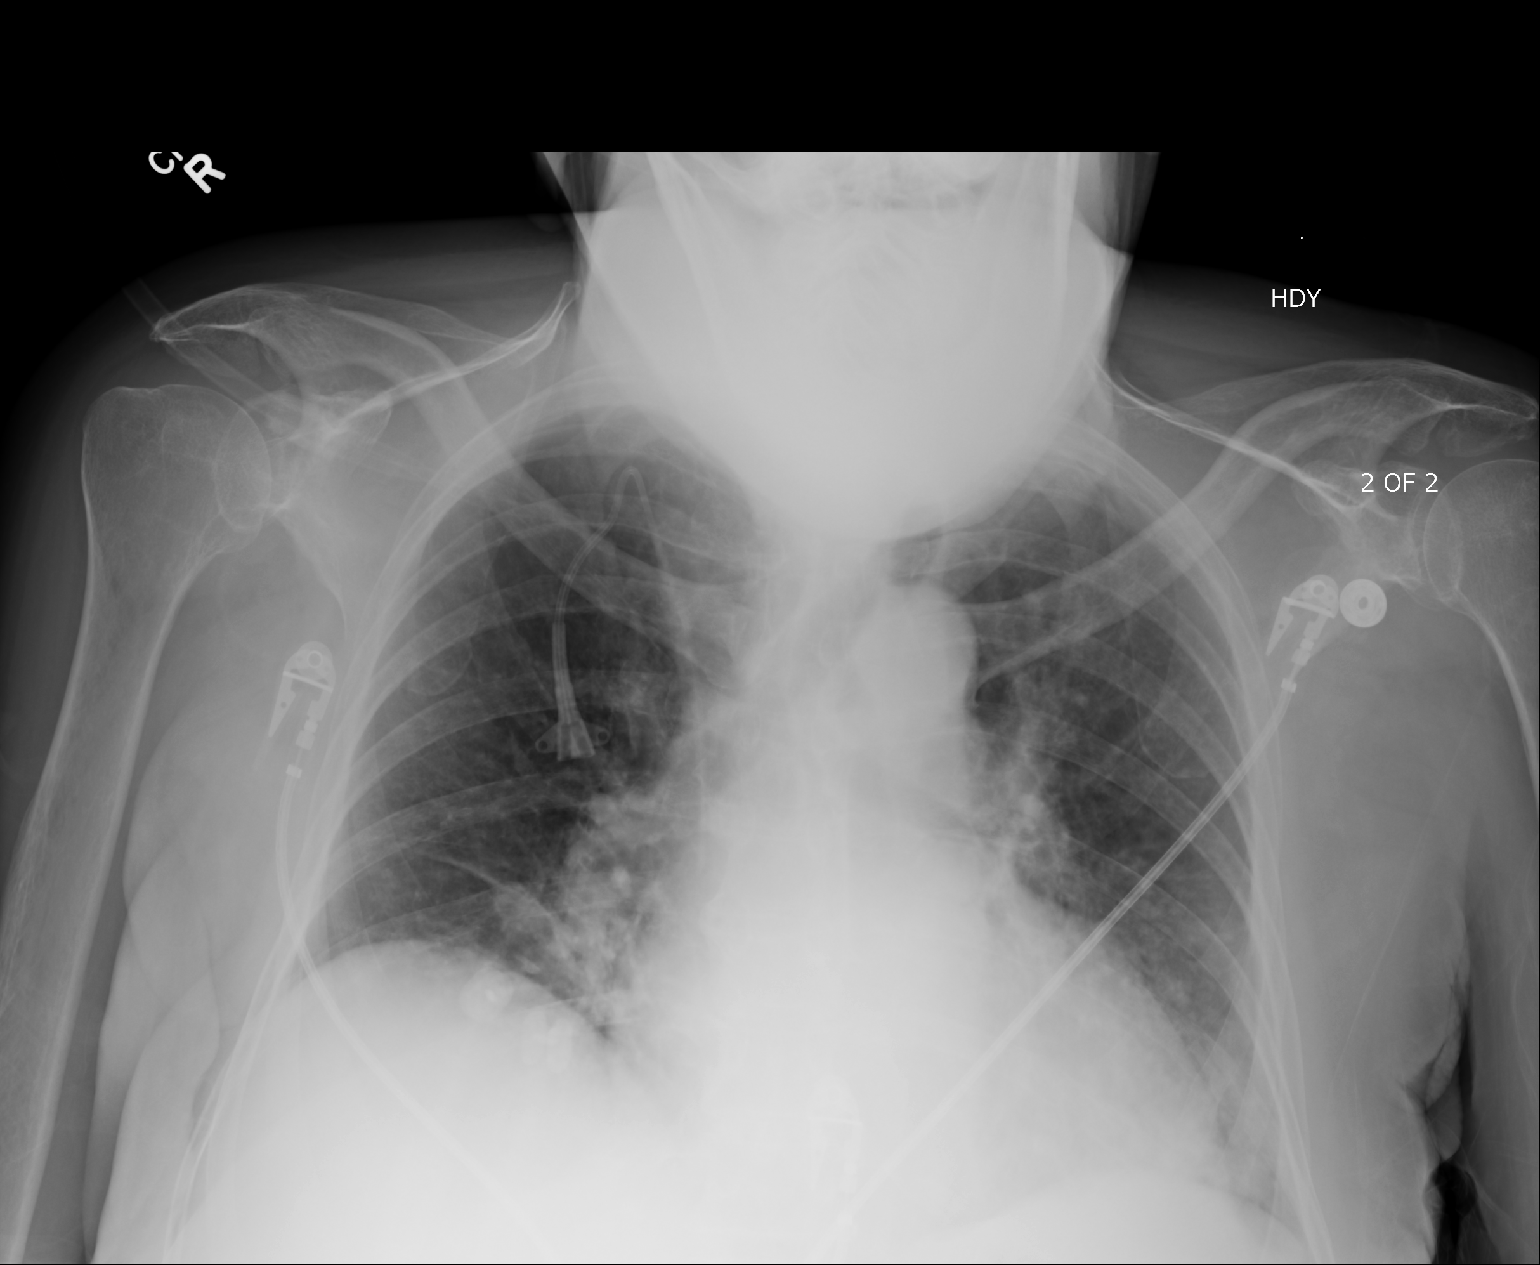
[im 2/2]
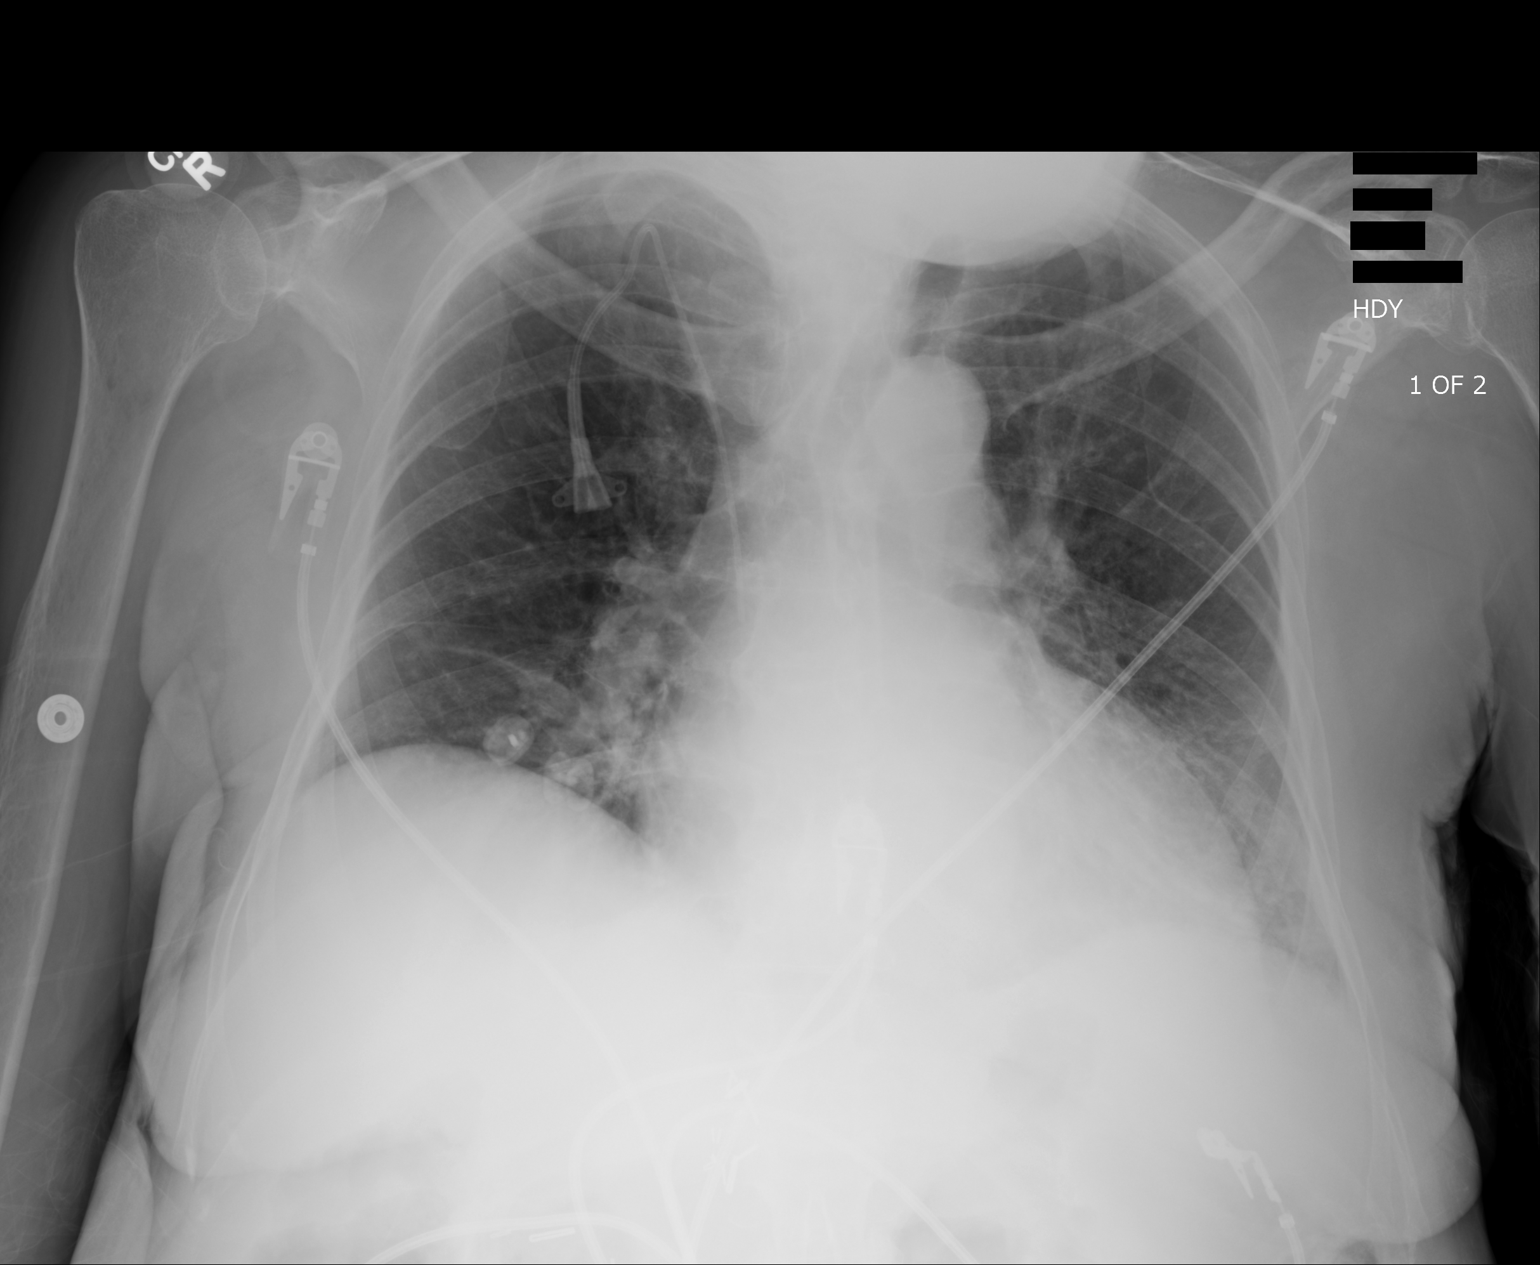

[2 of 2 positions shown; findings below may reference images not displayed]

FINDINGS: Right IJ central venous catheter tip overlies the cavoatrial junction given
patient positioning. No pneumothorax. Heart and mediastinum are stable. The
lung volumes are low. Heterogeneous opacities in the left lower lung are
similar to slightly decreased from prior. There are mild heterogeneous
opacities in the right lung base which could be secondary to atelectasis
given the right hemidiaphragm elevation. Infection is not excluded. Small
round densities overlying the right lower lung may be external to the
patient.
IMPRESSION: 1. Right IJ central venous catheter tip overlies the cavoatrial junction.
2. Heterogeneous opacities in the left lower lung are similar to slightly
decreased from prior.
3. Right basilar opacities may be secondary to atelectasis. Infection is not
excluded. Followup is recommended.

[REDACTED]

## 2013-09-05 ENCOUNTER — Ambulatory Visit: Payer: Self-pay | Admitting: Internal Medicine

## 2013-10-01 ENCOUNTER — Inpatient Hospital Stay: Payer: Self-pay | Admitting: Internal Medicine

## 2013-10-01 LAB — URINALYSIS, COMPLETE
BILIRUBIN, UR: NEGATIVE
Glucose,UR: NEGATIVE mg/dL (ref 0–75)
Hyaline Cast: 9
KETONE: NEGATIVE
Nitrite: NEGATIVE
Ph: 6 (ref 4.5–8.0)
RBC,UR: 655 /HPF (ref 0–5)
SPECIFIC GRAVITY: 1.013 (ref 1.003–1.030)
WBC UR: 299 /HPF (ref 0–5)

## 2013-10-01 LAB — CBC
HCT: 32.2 % — AB (ref 35.0–47.0)
HGB: 10.5 g/dL — AB (ref 12.0–16.0)
MCH: 25.1 pg — AB (ref 26.0–34.0)
MCHC: 32.5 g/dL (ref 32.0–36.0)
MCV: 77 fL — ABNORMAL LOW (ref 80–100)
Platelet: 233 10*3/uL (ref 150–440)
RBC: 4.16 10*6/uL (ref 3.80–5.20)
RDW: 15.2 % — AB (ref 11.5–14.5)
WBC: 15.4 10*3/uL — ABNORMAL HIGH (ref 3.6–11.0)

## 2013-10-01 LAB — COMPREHENSIVE METABOLIC PANEL
ANION GAP: 5 — AB (ref 7–16)
AST: 20 U/L (ref 15–37)
Albumin: 3 g/dL — ABNORMAL LOW (ref 3.4–5.0)
Alkaline Phosphatase: 95 U/L
BUN: 50 mg/dL — ABNORMAL HIGH (ref 7–18)
Bilirubin,Total: 0.4 mg/dL (ref 0.2–1.0)
CALCIUM: 10 mg/dL (ref 8.5–10.1)
CHLORIDE: 99 mmol/L (ref 98–107)
CO2: 31 mmol/L (ref 21–32)
Creatinine: 3.2 mg/dL — ABNORMAL HIGH (ref 0.60–1.30)
EGFR (African American): 16 — ABNORMAL LOW
EGFR (Non-African Amer.): 13 — ABNORMAL LOW
Glucose: 125 mg/dL — ABNORMAL HIGH (ref 65–99)
Osmolality: 285 (ref 275–301)
Potassium: 4.4 mmol/L (ref 3.5–5.1)
SGPT (ALT): 12 U/L — ABNORMAL LOW
Sodium: 135 mmol/L — ABNORMAL LOW (ref 136–145)
Total Protein: 7.3 g/dL (ref 6.4–8.2)

## 2013-10-01 LAB — PROTIME-INR
INR: 1.9
PROTHROMBIN TIME: 21.2 s — AB (ref 11.5–14.7)

## 2013-10-01 LAB — APTT: Activated PTT: 33.5 secs (ref 23.6–35.9)

## 2013-10-01 LAB — TROPONIN I: Troponin-I: <0.02 ng/mL

## 2013-10-02 LAB — BASIC METABOLIC PANEL
ANION GAP: 6 — AB (ref 7–16)
BUN: 48 mg/dL — ABNORMAL HIGH (ref 7–18)
CO2: 30 mmol/L (ref 21–32)
Calcium, Total: 9.2 mg/dL (ref 8.5–10.1)
Chloride: 102 mmol/L (ref 98–107)
Creatinine: 3 mg/dL — ABNORMAL HIGH (ref 0.60–1.30)
EGFR (African American): 17 — ABNORMAL LOW
EGFR (Non-African Amer.): 15 — ABNORMAL LOW
GLUCOSE: 67 mg/dL (ref 65–99)
Osmolality: 287 (ref 275–301)
POTASSIUM: 3.6 mmol/L (ref 3.5–5.1)
Sodium: 138 mmol/L (ref 136–145)

## 2013-10-02 LAB — CBC WITH DIFFERENTIAL/PLATELET
BASOS PCT: 0.8 %
Basophil #: 0.1 10*3/uL (ref 0.0–0.1)
EOS ABS: 0.1 10*3/uL (ref 0.0–0.7)
EOS PCT: 0.6 %
HCT: 29.8 % — AB (ref 35.0–47.0)
HGB: 9.7 g/dL — ABNORMAL LOW (ref 12.0–16.0)
LYMPHS ABS: 1.2 10*3/uL (ref 1.0–3.6)
Lymphocyte %: 12.8 %
MCH: 25.2 pg — AB (ref 26.0–34.0)
MCHC: 32.4 g/dL (ref 32.0–36.0)
MCV: 78 fL — ABNORMAL LOW (ref 80–100)
MONO ABS: 0.6 x10 3/mm (ref 0.2–0.9)
Monocyte %: 6.6 %
Neutrophil #: 7.4 10*3/uL — ABNORMAL HIGH (ref 1.4–6.5)
Neutrophil %: 79.2 %
Platelet: 227 10*3/uL (ref 150–440)
RBC: 3.83 10*6/uL (ref 3.80–5.20)
RDW: 15.6 % — ABNORMAL HIGH (ref 11.5–14.5)
WBC: 9.3 10*3/uL (ref 3.6–11.0)

## 2013-10-03 LAB — CBC WITH DIFFERENTIAL/PLATELET
BASOS ABS: 0.1 10*3/uL (ref 0.0–0.1)
Basophil %: 0.9 %
Eosinophil #: 0.1 10*3/uL (ref 0.0–0.7)
Eosinophil %: 1.4 %
HCT: 28.1 % — AB (ref 35.0–47.0)
HGB: 8.9 g/dL — AB (ref 12.0–16.0)
LYMPHS PCT: 10 %
Lymphocyte #: 0.7 10*3/uL — ABNORMAL LOW (ref 1.0–3.6)
MCH: 25 pg — AB (ref 26.0–34.0)
MCHC: 31.8 g/dL — AB (ref 32.0–36.0)
MCV: 78 fL — ABNORMAL LOW (ref 80–100)
Monocyte #: 0.5 x10 3/mm (ref 0.2–0.9)
Monocyte %: 8 %
NEUTROS ABS: 5.4 10*3/uL (ref 1.4–6.5)
Neutrophil %: 79.7 %
Platelet: 221 10*3/uL (ref 150–440)
RBC: 3.58 10*6/uL — AB (ref 3.80–5.20)
RDW: 15.2 % — ABNORMAL HIGH (ref 11.5–14.5)
WBC: 6.8 10*3/uL (ref 3.6–11.0)

## 2013-10-03 LAB — PROTIME-INR
INR: 2.2
PROTHROMBIN TIME: 24 s — AB (ref 11.5–14.7)

## 2013-10-03 LAB — BASIC METABOLIC PANEL
ANION GAP: 8 (ref 7–16)
BUN: 41 mg/dL — ABNORMAL HIGH (ref 7–18)
CALCIUM: 8.6 mg/dL (ref 8.5–10.1)
Chloride: 106 mmol/L (ref 98–107)
Co2: 28 mmol/L (ref 21–32)
Creatinine: 2.76 mg/dL — ABNORMAL HIGH (ref 0.60–1.30)
EGFR (African American): 19 — ABNORMAL LOW
GFR CALC NON AF AMER: 16 — AB
Glucose: 38 mg/dL — CL (ref 65–99)
OSMOLALITY: 290 (ref 275–301)
POTASSIUM: 3.4 mmol/L — AB (ref 3.5–5.1)
SODIUM: 142 mmol/L (ref 136–145)

## 2013-10-04 LAB — BASIC METABOLIC PANEL
ANION GAP: 6 — AB (ref 7–16)
BUN: 34 mg/dL — AB (ref 7–18)
CHLORIDE: 109 mmol/L — AB (ref 98–107)
Calcium, Total: 8.5 mg/dL (ref 8.5–10.1)
Co2: 27 mmol/L (ref 21–32)
Creatinine: 2.42 mg/dL — ABNORMAL HIGH (ref 0.60–1.30)
EGFR (African American): 22 — ABNORMAL LOW
EGFR (Non-African Amer.): 19 — ABNORMAL LOW
Glucose: 144 mg/dL — ABNORMAL HIGH (ref 65–99)
Osmolality: 293 (ref 275–301)
Potassium: 3.6 mmol/L (ref 3.5–5.1)
Sodium: 142 mmol/L (ref 136–145)

## 2013-10-04 LAB — URINE CULTURE

## 2013-10-04 LAB — URINALYSIS, COMPLETE
Bilirubin,UR: NEGATIVE
Glucose,UR: 50 mg/dL (ref 0–75)
Ketone: NEGATIVE
Nitrite: NEGATIVE
PH: 7 (ref 4.5–8.0)
Protein: 100
SPECIFIC GRAVITY: 1.009 (ref 1.003–1.030)
WBC UR: 10 /HPF (ref 0–5)

## 2013-10-04 LAB — PROTIME-INR
INR: 2.2
Prothrombin Time: 23.6 secs — ABNORMAL HIGH (ref 11.5–14.7)

## 2013-10-05 LAB — CBC WITH DIFFERENTIAL/PLATELET
BASOS ABS: 0.1 10*3/uL (ref 0.0–0.1)
Basophil %: 1.1 %
EOS PCT: 1.3 %
Eosinophil #: 0.1 10*3/uL (ref 0.0–0.7)
HCT: 28.2 % — AB (ref 35.0–47.0)
HGB: 8.9 g/dL — AB (ref 12.0–16.0)
Lymphocyte #: 0.9 10*3/uL — ABNORMAL LOW (ref 1.0–3.6)
Lymphocyte %: 14.2 %
MCH: 24.9 pg — ABNORMAL LOW (ref 26.0–34.0)
MCHC: 31.7 g/dL — ABNORMAL LOW (ref 32.0–36.0)
MCV: 79 fL — ABNORMAL LOW (ref 80–100)
MONOS PCT: 8.3 %
Monocyte #: 0.5 x10 3/mm (ref 0.2–0.9)
NEUTROS ABS: 4.7 10*3/uL (ref 1.4–6.5)
Neutrophil %: 75.1 %
Platelet: 223 10*3/uL (ref 150–440)
RBC: 3.59 10*6/uL — ABNORMAL LOW (ref 3.80–5.20)
RDW: 15.7 % — AB (ref 11.5–14.5)
WBC: 6.3 10*3/uL (ref 3.6–11.0)

## 2013-10-05 LAB — BASIC METABOLIC PANEL
Anion Gap: 8 (ref 7–16)
BUN: 30 mg/dL — AB (ref 7–18)
Calcium, Total: 8.9 mg/dL (ref 8.5–10.1)
Chloride: 108 mmol/L — ABNORMAL HIGH (ref 98–107)
Co2: 25 mmol/L (ref 21–32)
Creatinine: 2.39 mg/dL — ABNORMAL HIGH (ref 0.60–1.30)
EGFR (Non-African Amer.): 19 — ABNORMAL LOW
GFR CALC AF AMER: 22 — AB
GLUCOSE: 118 mg/dL — AB (ref 65–99)
Osmolality: 289 (ref 275–301)
Potassium: 4 mmol/L (ref 3.5–5.1)
SODIUM: 141 mmol/L (ref 136–145)

## 2013-10-07 LAB — URINE CULTURE

## 2013-10-10 ENCOUNTER — Other Ambulatory Visit: Payer: Self-pay | Admitting: Family Medicine

## 2013-10-10 LAB — PROTIME-INR
INR: 2.6
Prothrombin Time: 27 secs — ABNORMAL HIGH (ref 11.5–14.7)

## 2013-10-10 LAB — BASIC METABOLIC PANEL
ANION GAP: 11 (ref 7–16)
BUN: 21 mg/dL — ABNORMAL HIGH (ref 7–18)
CHLORIDE: 107 mmol/L (ref 98–107)
CO2: 23 mmol/L (ref 21–32)
Calcium, Total: 8.9 mg/dL (ref 8.5–10.1)
Creatinine: 2.58 mg/dL — ABNORMAL HIGH (ref 0.60–1.30)
EGFR (African American): 20 — ABNORMAL LOW
EGFR (Non-African Amer.): 18 — ABNORMAL LOW
Glucose: 225 mg/dL — ABNORMAL HIGH (ref 65–99)
OSMOLALITY: 291 (ref 275–301)
Potassium: 4.7 mmol/L (ref 3.5–5.1)
SODIUM: 141 mmol/L (ref 136–145)

## 2014-01-25 DEATH — deceased

## 2014-05-17 NOTE — Discharge Summary (Signed)
PATIENT NAME:  Catherine Burton, Catherine Burton MR#:  [id number] DATE OF BIRTH:  [birth year]  DATE OF ADMISSION:  04/08/2012  DATE OF DISCHARGE:  04/14/2012  PRIMARY CARE PHYSICIAN:  Dr. Vincente Poliimothy Beittel.   DISCHARGE DIAGNOSES: 1.  Altered mental status, metabolic encephalopathy due to infection and dehydration.  2.  Pneumonia, hospital-acquired pneumonia.  3.  Urinary tract infection.  4.  Acute on chronic renal failure.  5.  Paroxysmal atrial fib.  6.  Diabetes.  7.  Sacral decubitus ulcer stage III.  8.  Chronic respiratory failure on home oxygen.  9.  Chronic obstructive pulmonary disease.   CONDITION: Stable.   CODE STATUS: DO NOT RESUSCITATE.   HOME MEDICATIONS:  [facility]. The patient needs home oxygen.   DIET:  Low sodium, low fat, low cholesterol, ADA diet, pureed nectar-thick liquid diet. Please see the discharge instructions for details.    ACTIVITY:  As tolerated.   FOLLOWUP CARE:   Follow up with PCP within 1 to 2 weeks. The patient will be discharged to a skilled nursing facility with hospice care.   REASON FOR ADMISSION:  Altered mental status and shortness of breath.     HOSPITAL COURSE:  The patient is a 74 year old African American female with a history of multiple sclerosis, wheelchair and bed bound, presented in the ED with altered mental status and shortness of breath. The patient was noted to be more lethargic than her baseline, also was noted to have hypoxia. The patient's white count was 13,000 and creatinine increased to 2.24 from her baseline is 1.8, BUN was elevated at 59. ABG showed pO2 of 56. The patient's urinalysis showed nitrite positive. INR is 4.8. Chest x-ray showed left lower lung infiltrate. The patient was admitted for altered mental status with pneumonia and urinary tract infection. For detailed of history and physical examination, please refer to the admission note dictated by Dr. Cicero DuckElgegawy.     1.  Altered mental status, possibly related to dehydration and infection. The patient's mental status has been improving after treatment with antibiotics  and rehydration.  2.  Pneumonia. The patient has been treated with vancomycin  and Zosyn. Shortness of breath has been improving. WBC decreased to 7.1. We will change to p.o. Augmentin for 5 days after discharge.  3.  The patient has chronic respiratory failure on home oxygen. Will continue.  4.  For urinary tract infection, the patient has been on antibiotics. She is on chronic Foley due to neurogenic bladder.  5.  For sacral DU, the patient has been treated with wound care.  6.  The patient has coagulopathy due to warfarin. Warfarin was on hold. INR decreased  to 3 yesterday, so we will resume Coumadin and INR is 2.9 toay.  7.  Paroxysmal A. fib. The patient is on Coumadin, as mentioned above.  8.  For acute on chronic renal failure, the patient was rehydrated with IV fluids and BUN and creatinine were back to her baseline.  8.  For diabetes, the patient has been treated with sliding scale and Levemir.  9.  Hypertension; that has been treated with Lopressor and diltiazem. The patient's blood pressure has been stable.   DISCHARGE PLAN:  The patient is clinically stable and will be discharged to a skilled nursing facility with hospice care. Discussed the patient's discharge plan case manager and the hospice care staff.   TIME SPENT: About 42 minutes.    ____________________________ Shaune PollackQing Maruice Pieroni, MD qc:dmm D:  04/14/2012 10:27:08 ET T: 04/14/2012 11:13:39 ET JOB#: [id number]  cc: Shaune Pollack, MD, <Dictator> Shaune Pollack MD ELECTRONICALLY SIGNED 04/15/2012 18:07

## 2014-05-17 NOTE — Consult Note (Signed)
Brief Consult Note: Diagnosis: Stage 3 sacral decubitus ulcer, high risk surgical patient.   Patient was seen by consultant.   Consult note dictated.   Comments: agree with pressure relief, duoderm, no operative plans at this time.  Electronic Signatures: Natale LayBird, Wilena Tyndall (MD)  (Signed 16-Mar-14 14:19)  Authored: Brief Consult Note   Last Updated: 16-Mar-14 14:19 by Natale LayBird, Itzayanna Kaster (MD)

## 2014-05-17 NOTE — Consult Note (Signed)
  DATE OF BIRTH:  Jan 18, 1941  DATE OF CONSULTATION:  04/09/2012  CONSULTING PHYSICIAN:  Ayoub Arey A. Egbert GaribaldiBird, MD  REASON FOR CONSULTATION:  Evaluation of sacral decubitus ulcer.   HISTORY:  A 74 year old, bedridden, wheelchair-bound black female with a history of multiple medical comorbidities and multiple sclerosis, admitted to the hospital with confusion and shortness of breath. She was found to have a coagulopathy, with an INR of 4.8 on Coumadin. Baseline creatinine is 1.8. She has recurrent urinary tract infection with chronic indwelling Foley catheter and ESBL. Surgical Services were asked to evaluate sacral decubitus ulcer.  Currently the patient is being transferred to a [employer]  air mattress. The wound is being covered with DuoDERM. No further history could be obtained from the patient.   ALLERGIES: Multiple, and in the chart.   MEDICATIONS:  Baclofen, calcium, diltiazem, iron sulfate, influenza vaccine, sliding scale insulin, lactulose, lovastatin, Lopressor, multivitamins, omeprazole, Zosyn, senna, Spiriva, vancomycin.   Patient was on 6.5 mg of warfarin at bedtime.   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    PHYSICAL EXAMINATION:  There is an approximately 6 to 7 cm sacral decubitus ulcer, stage III, with some sloughing, but no tunneling and no signs of active infection. The remaining physical examination is significant for multiple contractures.   LABORATORY VALUES: White count 8.4, hemoglobin 8.9, platelet count 278,000. Creatinine 2.78, sodium 146, potassium 3.6.   IMPRESSION/Plan:  Stage III decubitus ulcer in a chronically bedbound, long-standing nursing home patient. I see no indication  for operative debridement. Certainly with the INR being 4.8, no interventions can be undertaken safely at this time. I would concur with air mattress therapy, pressure relief, DuoDERM to the sacrum, and frequent turning of the patient. No immediate operative plans are in mind at this point.     ____________________________ Redge GainerMark A. Egbert GaribaldiBird, MD mab:mr D: 04/09/2012 14:18:24 ET T: 04/09/2012 18:03:12 ET JOB#: [id number]  cc: Loraine LericheMark A. Egbert GaribaldiBird, MD, <Dictator> Castulo Scarpelli A Xeng Kucher MD ELECTRONICALLY SIGNED 04/17/2012 22:00

## 2014-05-17 NOTE — H&P (Signed)
PATIENT NAME:  Catherine Burton, HELBLING MR#:  [id number] DATE OF BIRTH:  Aug 31, 1940  DATE OF ADMISSION:  04/08/2012  REFERRING PHYSICIAN: Enedina Finner. Manson Passey, MD  PRIMARY CARE PHYSICIAN: Kathi Simpers. Beittel, MD  CHIEF COMPLAINT: Altered mental status and shortness of breath.   HISTORY OF PRESENT ILLNESS: This is a 74 year old female with past medical history of multiple sclerosis. The patient is in wheelchair and bedbound, with neurogenic bladder with recurrent urinary tract infection/chronic indwelling Foley catheter, diabetes and chronic kidney disease. The patient is a nursing home resident who was noticed to be more lethargic than her baseline. As well, she was noticed to have apneic episodes and hypoxic where she is known to be on oxygen at baseline, so patient who was sent to the ER. The patient is an extremely poor historian, has poor cognition at baseline, cannot give any history. Most of the history is obtained from ED staff and nursing home documentation and the patient's family at bedside. The patient was afebrile. The daughter at bedside reports the patient has poor cognition at baseline, but she appears more lethargic at this point. As well, she is bedbound, ambulatory occasionally with wheelchair. The patient had basic blood work done, which did show significant leukocytosis of 13,000. As well, the patient is known to have history of chronic kidney disease with baseline creatinine around 1.8. Creatinine was noticed to be elevated today at 2.24 with significantly elevated BUN at 59. As well, she appears to be clinically dehydrated. As well, the patient is known to be on warfarin for atrial fibrillation, where her INR was found to be 4.8. Her urinalysis was positive for nitrites and showing few white blood cells and positive for bacteria as well. Her ABG is showing hypocarbia and hypoxemia with pO2 of 56, but no evidence of acidosis. The patient's chest x-ray does show left lower lung infiltrate. As well,  the patient is known to have history of sacral ulcer, which appears to be having some mild foul-smelling odor and appears to be having eschar as well. Hospitalist services were requested to admit the patient for further management and treatment of her symptoms.   PAST MEDICAL HISTORY:  1.  Multiple sclerosis, wheelchair and bedbound.  2.  Neurogenic bladder with recurrent urinary tract infection with multiple admissions.  3.  Diabetes with neuropathy.  4.  Asthma.  5.  Chronic kidney disease stage III.  6.  Chronic anemia.  7.  Dyslipidemia.  8.  Constipation.  9.  History of left ankle fracture.  10.  Cervical spine stenosis.  11.  Hypertension.  12.  Pulmonary hypertension.  13.  Diastolic congestive heart failure.  14.  Paroxysmal atrial fibrillation.  15.  Obesity.   ALLERGIES: CODEINE, CELEXA, NOVOCAINE.   FAMILY HISTORY: Father has history of probable CVA. A first cousin has lupus and multiple sclerosis.   SOCIAL HISTORY: Obtained from previous records. The patient is a nursing home resident at [facility]. No history of tobacco or drug use.   HOME MEDICATIONS:  1.  Tramadol every 8 hours as needed for pain.  2.  Calcium carbonate 650 mg oral 2 times a day.  3.  Diltiazem extended-release 240 mg oral daily.  4.  Warfarin 6.5 mg at bedtime.  5.  Venlafaxine 100 mg oral daily.  6.  Lantus 20 units subcutaneous at bedtime 7.  NovoLog 4 units 3 times a day after meals and 8 units 3 times a day before meals.  8.  Norvasc 20 mg  oral daily.  9.  Alprazolam 0.25 mg daily for anxiety.  10.  Metoprolol tartrate 25 mg oral 2 times a day.  11.  Advair Diskus 250/50, 1 puff inhalation 2 times a day.  12.  Albuterol as needed.  13.  Spiriva 18 mcg inhalation daily.  14.  Ferrous sulfate 325 mg oral daily.  15.  Lactulose 30 mL at bedtime.  16.  Senokot for constipation daily.  17.  Baclofen 10 mg oral 4 times a day.  18.  Omeprazole 20 mg delayed-release daily.  19.   Hydralazine 25 mg oral 2 times a day.  20.  Multivitamin 1 tablet oral daily.  21.  Vitamin D 350,000 units 1 capsule monthly.  REVIEW OF SYSTEMS: Unable to obtain secondary to the patient's altered mental status.   PHYSICAL EXAMINATION:  VITAL SIGNS: Temperature 98.5, pulse 111, respiratory rate 12, blood pressure 110/67, saturating 94% on 4 liters nasal cannula.  GENERAL: Chronically ill-appearing female, looks comfortable, in no apparent distress.  HEENT: Head atraumatic, normocephalic. Pupils equal, reactive to light. Dry oral mucosa. Has nasal cannula.  NECK: Supple. No thyromegaly. No JVD. No adenopathy. CARDIOVASCULAR: S1, S2 heard. No rubs, murmur, gallops. Irregularly irregular rhythm.  LUNGS: Good air entry bilaterally with left side lung rales and rhonchi.  ABDOMEN: Soft, nontender, nondistended. Bowel sounds present.  EXTREMITIES: Contracted, +1 pitting edema bilaterally. The patient is wearing pressure ulcer protective boots.  MUSCULOSKELETAL: Appears to be contracted with muscle wasting.  SKIN: The patient has left decubitus ulcers, unstageable secondary to eschar, with some mildly foul-smelling odor.  NEUROLOGIC: Unable to evaluate appropriately secondary to altered mental status. The patient appears to be awake, verbalizes occasionally, minimally communicative.  PSYCHIATRIC: The patient is awake, has significant decrease in cognitive function.  PERTINENT LABORATORY DATA: Glucose 106, BUN 59, creatinine 2.24, sodium 144, potassium 4.6, chloride 114, CO2 of 24. Troponin less than 0.02. White blood cells 15.2, hemoglobin 9.8, hematocrit 31.2, platelets 314. INR 4.8, prothrombin time 43.1. Urinalysis showing bacteria +1, white blood cells 3 and nitrites positive. ABG showing pH of 7.46, pCO2 of 32, pO2 of 56.   ASSESSMENT AND PLAN: This is a 74 year old female who is a nursing home resident, presents with altered mental status, has left lung infiltrate and chronic indwelling Foley  catheter and possibly infected sacral ulcer.  1.  Altered mental status: Most likely related to infection and dehydration giving patient leukocytosis and worsening renal failure. It might be related to her pneumonia, possible urinary tract infection, possibly infected decubitus ulcer.  2.  Pneumonia: The patient has left lung opacity. Will be treated for healthcare-acquired pneumonia. Will be started on vancomycin and Zosyn. Already on home oxygen.  3.  Chronic respiratory failure: The patient is already on home oxygen. It is hypoxemic respiratory failure as appears by her ABG.  4.  Urinary tract infection: Urinalysis is a little inconclusive. Will change Foley catheter. She has positive nitrites and +1 bacteria but only 3 white blood cells. The patient is already covered on broad-spectrum antibiotics. Will follow on the urine cultures and adjust antibiotics if needed.  5.  Sacral ulcers: Appear to be toxic and infected. Will consult surgery for possible need for debridement. She is on vancomycin and Zosyn.  6.  Coagulopathy with INR of 4.8: This is related to warfarin. Will hold it until her INR is less than 3.  7.  Paroxysmal atrial fibrillation: Rate is acceptable, slightly on the higher side, but will go back to acceptable level  once she is appropriately hydrated and her infection is treated. Will continue her on rate controlling agents, diltiazem and metoprolol, and will resume her warfarin once her INR is less than 3, with target INR 2 to 3.  8.  Acute on chronic renal failure: This is due to dehydration. Will continue with fluids.  9.  Multiple sclerosis: Will continue with supportive care.  10.  Diabetes mellitus: Will hold all long-acting insulin and have her on insulin sliding scale until she is evaluated by swallow service.  11.  Asthma: Will continue her on Advair and as-needed albuterol and Spiriva.  12.  Hypertension: Blood pressure is acceptable, actually on the softer side. Will keep her  on metoprolol and diltiazem, and will resume Norvasc when it is elevated.  13.  Deep vein thrombosis prophylaxis: The patient is on warfarin for paroxysmal atrial fibrillation. No need for further chemical anticoagulation.  14.  Gastrointestinal prophylaxis: The patient is on proton pump inhibitor.   CODE STATUS: Discussed with daughter at bedside who reports patient is a full code.   TOTAL TIME SPENT ON ADMISSION AND PATIENT CARE: 60 minutes.   ____________________________ Starleen Armsawood S. Teagyn Fishel, MD dse:jm D: 04/08/2012 03:58:35 ET T: 04/08/2012 14:00:42 ET JOB#: [id number]  cc: Starleen Armsawood S. Davied Nocito, MD, <Dictator> Cherilynn Schomburg Teena IraniS Valisha Heslin MD ELECTRONICALLY SIGNED 04/09/2012 4:15

## 2014-05-18 NOTE — Discharge Summary (Signed)
PATIENT NAME:  Catherine Burton, [name] MR#:  [id number] DATE OF BIRTH:  [birth year]  DATE OF ADMISSION:  10/01/2013 DATE OF DISCHARGE:  10/05/2013  PRIMARY CARE PHYSICIAN: Vincente Poliimothy Beittel, MD  FINAL DIAGNOSES: 1.  Acute encephalopathy.  2.  Hypoglycemia with diabetes.  3.  Acute on chronic renal failure.  4.  Dysphagia.  5.  Chronic Foley.  6.  Sacral decubiti.  7.  Paroxysmal atrial fibrillation.  8.  Multiple sclerosis. 9.  Chronic respiratory failure.  10.  Anemia.  11.  No signs of congestive heart failure on this hospital stay.   MEDICATIONS ON DISCHARGE: Include omeprazole 20 mg daily, ferrous sulfate 325 mg daily, lactulose 30 mL once a day at bedtime, hydralazine 25 mg every 8 hours, DuoNeb nebulizer solution 3 mL inhaled twice a day, MiraLax 17 grams daily, diltiazem 60 mg every 6 hours, Coumadin 5 mg at 5:00 p.m., Toprol-XL 75 mg daily, Lasix 20 mg daily, Humulin R sliding scale.   NOTE: Stop taking Mevacor, Lantus, Ensure, venlafaxine and Citracal.   DIET: Low sodium, carbohydrate-controlled diet, puree honey thick liquids, strict aspiration precautions, meds in puree, crush if able, [employer] Cup and MightyShakes.   DISCHARGE INSTRUCTIONS: Follow up in 1 to 2 days with Dr. Otho Darnerim Beittel.  REASON FOR ADMISSION: The patient was admitted October 01, 2013 with altered mental status likely secondary to hypoglycemia and potential urinary tract infection. The patient was started on Rocephin.  DIAGNOSTIC DATA: Laboratory and radiological data during the hospital course included urinalysis with 3+ leukocyte esterase, 2+ bacteria, 299 white blood cell count. INR 1.9. Troponins negative. Glucose 125, BUN 50, creatinine 3.2, sodium 135, potassium 4.4, chloride 99, CO2 31, calcium 10. Liver function tests normal range. White blood cell count 15.4, H and H 10.5 and 32.2.   EKG showed a normal sinus rhythm.   Chest x-ray: Cardiomegaly.   CT scan of the head: Atrophy with small vessel ischemic  disease.   Creatinine upon discharge 2.39. Hemoglobin 8.9 and white blood cell count 6.3. On the day prior to discharge, urine culture grew out ESBL, greater than 100,000. Repeat urinalysis on the same day showed only 1+ blood, negative nitrites, trace leukocyte esterase, and only 10 white blood cells per high-power field.  HOSPITAL COURSE PER PROBLEM LIST:  1.  For the acute encephalopathy, likely secondary to hypoglycemia. This has improved.  2.  Hypoglycemia with diabetes, likely worsened with the acute on chronic renal failure. I just have her on sliding scale at this point. Lantus is stopped. She may not take in hat much with the dysphagia diet so I would rather go with sliding scale at this point in time.  3.  Acute on chronic renal failure. I gave IV fluid hydration the entire hospital course. Creatinine improved with fluid hydration.  4.  Dysphagia diet, strict aspiration precautions.  5.  Chronic Foley. Initially thought this was a UTI on a patient that has a chronic Foley. I did recommend removing the Foley. The Foley was removed in the Emergency Room. They did start antibiotics just in case. Since the patient was not septic, I stopped antibiotics on the 9th when I saw the patient. I was a little hesitant when the ESBL grew out. I did speak with Dr. Sampson GoonFitzgerald. It could be a contaminate so we repeated a urinalysis. Urinalysis looked much better. The patient is not septic. I will hold off on any antibiotics. I do not treat chronic Foley colonizations with antibiotics. With that in mind, of  note, if the patient declines in status or has high fever, you can start IM ertapenem 0.5 grams IM daily for 10 days, if the patient has high fever, but at this point I would hold off.  6.  Sacral decubiti. Since the patient is bedbound, this may be addressed at the facility. Keep it covered.  7.  Paroxysmal atrial fibrillation. The patient is on Coumadin. Recommend INR checks on a weekly basis.  8.  MS,  bedbound.  9.  Chronic respiratory failure. On oxygen.  10.  Anemia. On ferrous sulfate.  11.  No signs of congestive heart failure on this hospital stay. The patient was actually given IV fluids during the entire hospital stay secondary to acute on chronic renal failure. Must watch closely on low-dose Lasix as outpatient. The patient may end up becoming too dehydrated on the dysphagia diet.   TIME SPENT ON DISCHARGE: 35 minutes.   ____________________________ Herschell Dimes. Renae Gloss, MD rjw:sb D: 10/05/2013 08:48:28 ET T: 10/05/2013 09:10:51 ET JOB#: [id number]  cc: Herschell Dimes. Renae Gloss, MD, <Dictator> Kathi Simpers. Candie Echevaria, MD Salley Scarlet MD ELECTRONICALLY SIGNED 10/09/2013 12:27

## 2014-05-18 NOTE — H&P (Signed)
PATIENT NAME:  Catherine Burton, Catherine Burton MR#:  [id number] DATE OF BIRTH:  03-11-1940  DATE OF ADMISSION:  10/01/2013  PRIMARY CARE PHYSICIAN:  Kathi Simpersimothy Burton. Beittel, MD  CHIEF COMPLAINT: Altered mental status.   HISTORY OF PRESENT  ILLNESS: Catherine Burton is a 74 year old female with a history of multiple sclerosis, wheelchair and bed bound with a neurogenic bladder with multiple urinary tract infections. Is brought to the Emergency Department for hypoglycemia. When EMS arrived, patient received glucagon with the improvement of the blood sugars to 120s. The patient continues to be lethargic. Concerning this, patient is brought to the Emergency Department. Workup in the Emergency Department; the patient is found to have urinary tract infection with leukocyte esterase of 3+ with a WBC of 299. A CT head without contrast is unremarkable. The patient is found to have elevated white blood cell count of 15,000 with increased BUN and creatinine of 50 and 3.20. The patient's baseline creatinine of 1.9 about a year back. Unable to obtain any history from the patient secondary to patient's dysarthria.   PAST MEDICAL HISTORY:  1.  Multiple sclerosis, wheelchair bound.  2.  Neurogenic bladder with recurrent urinary tract infections.  3.  Diabetic retinopathy.  4.  Asthma.  5.  Chronic kidney disease stage III.  6.  Chronic anemia.  7.  Dyslipidemia.  8.  Constipation.  9.  Left ankle fracture. 10.  Cervical spine stenosis.  11.  Hypertension.  12.  Pulmonary hypertension.  13.  Diastolic congestive heart failure.  14.  Paroxysmal atrial fibrillation.  15.  Obesity.   ALLERGIES: 1.   CODEINE.  2.   . 3.  NOVOCAINE.   HOME MEDICATIONS:  1.  Coumadin 4 mg daily.  2.  Vitamin D3, 1 capsule once a month.  3.  Venlafaxine 100 mg once a day.  4.  Spiriva once a day.  5.  Senokot once a day.  6.  Omeprazole 20 mg once a day.  7.  Mevacor 20 mg daily.  8.  Metoprolol 25 mg 2 times a day.  9.  Lantus 20 units  subcutaneous daily.  10.  Lactulose daily.  11.    2.5 mg once a day.  12.  Hydralazine 25 mg 2 times a day.  13.  Ferrous sulfate 325 mg once a day.  14.  Cardizem 1 capsule once a day. 15.  Calcium carbonate 650 mg 2 times a day.  16.   0.25 mg once a day.  17.  Albuterol   SOCIAL HISTORY: No history of alcohol, tobacco, or drug use[facility]   FAMILY HISTORY: Father had possible CVA. First cousin has lupus and multiple sclerosis.  REVIEW OF SYSTEMS: Could not be obtained from the patient secondary to patient's slow responsiveness.   PHYSICAL EXAMINATION:  GENERAL: This is a chronically ill-looking female lying down in the bed, not in distress.  VITAL SIGNS: Temperature 97.2, pulse 57, blood pressure 118/68, respiratory rate of 18, oxygen saturation 100% on room air.  HEENT: Head normocephalic, atraumatic. There is no scleral icterus. Conjunctivae normal. Pupils equal and reactive. Mucous membranes are dry. No pharyngeal erythema.  NECK: Supple. No lymphadenopathy. No JVD. No carotid bruit.  CHEST: Has no focal tenderness.  LUNGS: Bilaterally clear to auscultation.  HEART: S1, S2 regular. No murmurs are heard.  ABDOMEN: Bowel sounds present. Soft, nontender, nondistended.  EXTREMITIES: No pedal edema. Pulses 2+.  SKIN: No rash or lesions.  MUSCULOSKELETAL: The patient has MS which  limits to examine. NEUROLOGIC: Patient is alert, oriented to self and place. Could not tell the time. No apparent cranial nerve abnormalities. Contracted extremities.   LABORATORY DATA: BUN 50, creatinine of 3.20. Bicarb 16. Rest of all the values are within normal limits.   ASSESSMENT AND PLAN:  1.  Altered mental status most likely secondary to hyperglycemia and urinary tract infection. Continue with intravenous fluids. 2.  Hyperglycemia Will continue to follow up with glucose limits. The patient seems to be at baseline.  3.  Urinary tract infection. Obtain urine cultures. Keep the  patient on Rocephin.  4.  History of multiple sclerosis. Continue supportive care.  5.  Diabetes mellitus.  6.  Hypoglycemia. Will hold the insulin and continue with the fluids and follow up.  7.  Hypertension. Currently well controlled. Continue with the home medications.  8.  Keep the patient on deep vein thrombosis prophylaxis with Lovenox.   TIME SPENT: 50 minutes.   ____________________________ Susa Griffins, MD pv:am D: 10/02/2013 00:59:09 ET T: 10/02/2013 04:17:12 ET JOB#: [id number]  cc: Susa Griffins, MD, <Dictator> Susa Griffins MD ELECTRONICALLY SIGNED 10/10/2013 21:35
